# Patient Record
Sex: Female | Born: 1990 | Race: White | Hispanic: No | Marital: Married | Smoking: Never smoker
Health system: Southern US, Community
[De-identification: ages and names within clinical notes are randomized; demographics above are authoritative.]

## PROBLEM LIST (undated history)

## (undated) ENCOUNTER — Inpatient Hospital Stay (HOSPITAL_COMMUNITY): Payer: Self-pay

## (undated) DIAGNOSIS — D8940 Mast cell activation, unspecified: Secondary | ICD-10-CM

## (undated) DIAGNOSIS — B019 Varicella without complication: Secondary | ICD-10-CM

## (undated) DIAGNOSIS — S60811A Abrasion of right wrist, initial encounter: Secondary | ICD-10-CM

## (undated) DIAGNOSIS — U071 COVID-19: Secondary | ICD-10-CM

## (undated) DIAGNOSIS — I071 Rheumatic tricuspid insufficiency: Secondary | ICD-10-CM

## (undated) DIAGNOSIS — W57XXXA Bitten or stung by nonvenomous insect and other nonvenomous arthropods, initial encounter: Secondary | ICD-10-CM

## (undated) DIAGNOSIS — J45909 Unspecified asthma, uncomplicated: Secondary | ICD-10-CM

## (undated) DIAGNOSIS — N200 Calculus of kidney: Secondary | ICD-10-CM

## (undated) DIAGNOSIS — J45990 Exercise induced bronchospasm: Secondary | ICD-10-CM

## (undated) DIAGNOSIS — T7840XA Allergy, unspecified, initial encounter: Secondary | ICD-10-CM

## (undated) DIAGNOSIS — K219 Gastro-esophageal reflux disease without esophagitis: Secondary | ICD-10-CM

## (undated) DIAGNOSIS — J189 Pneumonia, unspecified organism: Secondary | ICD-10-CM

## (undated) DIAGNOSIS — Z8759 Personal history of other complications of pregnancy, childbirth and the puerperium: Secondary | ICD-10-CM

## (undated) HISTORY — PX: WISDOM TOOTH EXTRACTION: SHX21

## (undated) HISTORY — DX: Gastro-esophageal reflux disease without esophagitis: K21.9

## (undated) HISTORY — DX: COVID-19: U07.1

## (undated) HISTORY — DX: Varicella without complication: B01.9

## (undated) HISTORY — DX: Exercise induced bronchospasm: J45.990

## (undated) HISTORY — DX: Rheumatic tricuspid insufficiency: I07.1

## (undated) HISTORY — DX: Allergy, unspecified, initial encounter: T78.40XA

## (undated) HISTORY — DX: Calculus of kidney: N20.0

## (undated) HISTORY — DX: Abrasion of right wrist, initial encounter: S60.811A

## (undated) HISTORY — DX: Bitten or stung by nonvenomous insect and other nonvenomous arthropods, initial encounter: W57.XXXA

## (undated) HISTORY — DX: Unspecified asthma, uncomplicated: J45.909

---

## 2017-06-04 DIAGNOSIS — J189 Pneumonia, unspecified organism: Secondary | ICD-10-CM

## 2017-06-04 HISTORY — DX: Pneumonia, unspecified organism: J18.9

## 2017-07-31 ENCOUNTER — Encounter: Payer: Self-pay | Admitting: Internal Medicine

## 2017-07-31 ENCOUNTER — Ambulatory Visit: Payer: Self-pay | Admitting: Internal Medicine

## 2017-07-31 VITALS — BP 100/60 | HR 76 | Temp 98.1°F | Ht 68.0 in | Wt 127.0 lb

## 2017-07-31 DIAGNOSIS — R631 Polydipsia: Secondary | ICD-10-CM | POA: Insufficient documentation

## 2017-07-31 DIAGNOSIS — J329 Chronic sinusitis, unspecified: Secondary | ICD-10-CM | POA: Diagnosis not present

## 2017-07-31 DIAGNOSIS — Z0184 Encounter for antibody response examination: Secondary | ICD-10-CM | POA: Diagnosis not present

## 2017-07-31 DIAGNOSIS — Z1159 Encounter for screening for other viral diseases: Secondary | ICD-10-CM

## 2017-07-31 DIAGNOSIS — Z1389 Encounter for screening for other disorder: Secondary | ICD-10-CM

## 2017-07-31 DIAGNOSIS — D229 Melanocytic nevi, unspecified: Secondary | ICD-10-CM

## 2017-07-31 DIAGNOSIS — Z0000 Encounter for general adult medical examination without abnormal findings: Secondary | ICD-10-CM | POA: Diagnosis not present

## 2017-07-31 DIAGNOSIS — E559 Vitamin D deficiency, unspecified: Secondary | ICD-10-CM | POA: Diagnosis not present

## 2017-07-31 DIAGNOSIS — Z1329 Encounter for screening for other suspected endocrine disorder: Secondary | ICD-10-CM

## 2017-07-31 DIAGNOSIS — Z1283 Encounter for screening for malignant neoplasm of skin: Secondary | ICD-10-CM

## 2017-07-31 DIAGNOSIS — J069 Acute upper respiratory infection, unspecified: Secondary | ICD-10-CM | POA: Diagnosis not present

## 2017-07-31 HISTORY — DX: Polydipsia: R63.1

## 2017-07-31 HISTORY — DX: Melanocytic nevi, unspecified: D22.9

## 2017-07-31 MED ORDER — AZITHROMYCIN 250 MG PO TABS: ORAL_TABLET | ORAL | 0 refills | Status: DC

## 2017-07-31 NOTE — Progress Notes (Addendum)
Chief Complaint  Patient presents with  . New Patient (Initial Visit)   New patient  1. She is c/w increased thirst x 2 years with h/o severe concussion with diabetes insipidus and wants to see an endocrinologist to have further w/u. She reports despite drinking plenty of water throughout the day she is still thirsty denies dry mouth or eyes. Reports she has been so thirsty and felt dehydrated in the past. She has a friend with DI and doing her research and after disc sx's with friend she feels like she has this and before getting pregnant she would like endocrinology w/u  2. Multiple nevi trunk not seen dermatology in years would like referral  3. New to area and needs name of OB/GYN last Dr. Mordecai Rasmussen she had 1st pap 1 year ago when she 89st became sexually active  4. C/o runny nose, cough x 1 week with h/o sinus infection. [facility] tx'ed with Zpack and felt better. She has tried nyquil for sx's at night and otc antihistamine     Review of Systems  Constitutional: Negative for fever.  HENT: Negative for sinus pain and sore throat.   Eyes: Negative for blurred vision.  Respiratory: Negative for cough.   Cardiovascular: Negative for chest pain.  Gastrointestinal: Negative for abdominal pain and nausea.  Musculoskeletal: Negative for falls.  Skin:       +moles   Neurological: Negative for headaches.  Endo/Heme/Allergies:       +increased thirst   Psychiatric/Behavioral: Negative for depression.   Past Medical History:  Diagnosis Date  . Allergy   . Asthma    exercise induced  . Chicken pox   . GERD (gastroesophageal reflux disease)    Past Surgical History:  Procedure Laterality Date  . WISDOM TOOTH EXTRACTION     Family History  Problem Relation Age of Onset  . Hypertension Mother   . Arthritis Maternal Grandmother   . Alcohol abuse Maternal Grandfather   . Diabetes Maternal Grandfather   . Cancer Paternal Grandmother        breast   .  Cancer Paternal Grandfather        prostate   . Miscarriages / [place] Sister    Social History   Socioeconomic History  . Marital status: Married    Spouse name: Not on file  . Number of children: Not on file  . Years of education: Not on file  . Highest education level: Not on file  Occupational History  . Not on file  Social Needs  . Financial resource strain: Not on file  . Food insecurity:    Worry: Not on file    Inability: Not on file  . Transportation needs:    Medical: Not on file    Non-medical: Not on file  Tobacco Use  . Smoking status: Never Smoker  . Smokeless tobacco: Never Used  Substance and Sexual Activity  . Alcohol use: Yes  . Drug use: Yes    Comment: husband   . Sexual activity: Yes    Partners: Male  Lifestyle  . Physical activity:    Days per week: Not on file    Minutes per session: Not on file  . Stress: Not on file  Relationships  . Social connections:    Talks on phone: Not on file    Gets together: Not on file    Attends religious service: Not on file    Active member of club or organization:  Not on file    Attends meetings of clubs or organizations: Not on file    Relationship status: Not on file  . Intimate partner violence:    Fear of current or ex partner: Not on file    Emotionally abused: Not on file    Physically abused: Not on file    Forced sexual activity: Not on file  Other Topics Concern  . 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 Prenatal Vit-Fe Fumarate-FA (PRENATAL MULTIVITAMIN) TABS tablet Take 1 tablet by mouth daily at 12 noon.   Allergies  Allergen Reactions  . Tetracycline Hives    Sob   No results found for this or any previous visit (from the past 2160 hour(s)). Objective  Body mass index is 19.31 kg/m. Wt Readings from Last 3  Encounters:  07/31/17 127 lb (57.6 kg)   Temp Readings from Last 3 Encounters:  07/31/17 98.1 F (36.7 C) (Oral)   BP Readings from Last 3 Encounters:  07/31/17 100/60   Pulse Readings from Last 3 Encounters:  07/31/17 76    Physical Exam  Constitutional: She is oriented to person, place, and time. Vital signs are normal. She appears well-developed and well-nourished. She is cooperative.  HENT:  Head: Normocephalic and atraumatic.  Mouth/Throat: Oropharynx is clear and moist and mucous membranes are normal.  Eyes: Pupils are equal, round, and reactive to light. Conjunctivae are normal.  Cardiovascular: Normal rate, regular rhythm and normal heart sounds.  Pulmonary/Chest: Effort normal and breath sounds normal.  Neurological: She is alert and oriented to person, place, and time. Gait normal.  Skin: Skin is warm, dry and intact.  Multiple nevi to trunk   Psychiatric: She has a normal mood and affect. Her speech is normal and behavior is normal. Judgment and thought content normal. Cognition and memory are normal.  Nursing note and vitals reviewed.   Assessment   1. 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 Multiple nevi trunk  3. URI/sinusitis with h/o asthma  4. HM Plan   1.  Check labs today  Refer to [place] Dr. Gabriel Carina further w/u per pt request  2. Refer to Dr. Kellie Moor tbse  3. Supportive care if not better trial of zpack  4.  Did not have flu shot this year  Tdap 01/22/01 pt unsure if had since then look prior pcp records  Had gardisil 1/3 shots  Had hep B, MMR Had meningo vaccine 12/30/03  check hep B and MMR titer Pap had 04/13/16 neg pap HPV not tested Dr. Mikey Kirschner   Get records [facility] prior PCP Get records OB/GYN Dr. Patrick North pap given name of Dr. Georgianne Fick today LMP 07/16/17  -obtained records Dr. Christoper Fabian reviewed and scanned  Consider check lipid in future not fasting today  Provider: Dr. Olivia Mackie [name]

## 2017-07-31 NOTE — Patient Instructions (Addendum)
I will refer you to [facility] endocrine requested  Dr. Gabriel Carina [place] [phone]   I will refer you to dermatology Dr. Karle Barr  [place] [phone] for skin check   I rec OB/GYN Dr. Ander Purpura [facility] [place] [phone]     Diabetes Insipidus Diabetes insipidus (DI) is a rare condition that causes the body to produce more urine than normal, which leads to thirst and dehydration. The urine is made mostly of water (dilute urine). There are four types of DI:  Central DI. This is the most common type.  Dipsogenic DI.  Nephrogenic DI.  Pregnancy-related DI.  The most common forms are related to decreased production of the hormone that regulates urine production (antidiuretic hormone) or resistance to this hormone. DI can be managed with treatment and is not usually serious. This condition is not related to type 1 or type 2 diabetes mellitus, in which blood sugar (glucose) levels are too high. DI affects mostly adults, but it can happen at any age. What are the causes? Central DI is caused by damage to the pituitary gland or hypothalamus in the brain. Dipsogenic DI is caused by a defect in the thirst mechanism in the brain. This defect causes you to drink too much fluid. These may result from:  Brain surgery.  Infection.  Inflammation.  Brain tumor.  Head injury.  Nephrogenic DI is caused by the kidneys not responding to the antidiuretic hormone in the body. This may result from:  Chronic kidney disease (CKD).  Certain medicines, such as lithium.  Low potassium levels.  High calcium levels.  Pregnancy-related DI is caused by the antidiuretic hormone not working properly in the body. This results from having a temporary form of diabetes mellitus that develops during pregnancy (gestational diabetes mellitus). What are the signs or symptoms? Symptoms of this condition include:  Excessive urination. This means  urinating more than 10 cups (2.4 L) during a period of 24 hours.  Excessive thirst.  Frequent nighttime urination (nocturia).  Nausea.  Diarrhea.  How is this diagnosed? This condition may be diagnosed based on:  Your medical history.  A physical exam.  Blood tests.  Urine tests.  How is this treated? Once your specific type of diabetes insipidus is diagnosed, treatment may include one or more of the following:  Increasing or limiting your fluid intake.  Taking medicines that contain artificial (synthetic) versions of the antidiuretic hormone.  Stopping certain medicines that you take.  Correcting the balance of minerals (electrolytes) in your body.  Changing your diet. You may be put on a low-protein or low-sodium diet.  You may need to visit your health care provider regularly to make sure your condition is being treated properly. You may also need to work with providers who specialize in:  Kidney problems (nephrologist).  Hormone disorders (endocrinologist).  Follow these instructions at home:  Follow instructions from your health care provider about how much fluid and water to drink. You may be directed to drink more fluids and water, or to limit how much fluid and water you drink.  Follow instructions from your health care provider about eating or drinking restrictions.  Take over-the-counter and prescription medicines only as told by your health care provider.  Return to your normal activities as told by your health care provider. Ask your health care provider what activities are safe for you.  If directed, monitor your risk of dehydration in extreme heat.  Keep all follow-up visits as told by your health care provider. This is important.  Carry a medical alert card or wear medical alert jewelry. Contact a health care provider if:  You continue to have symptoms after treatment. Get help right away if:  You have extreme thirst.  You have symptoms of  severe dehydration, such as rapid heart rate, muscle cramps, or confusion. Summary  Diabetes insipidus (DI) is a rare condition that causes the body to produce more urine than normal, which leads to thirst and dehydration.  Follow instructions from your health care provider about eating or drinking restrictions.  Treatment may include increasing or limiting your fluid intake and correcting the balance of minerals (electrolytes) in your body.  Get help right away if you have symptoms of severe dehydration, such as rapid heart rate, muscle cramps, or confusion. This information is not intended to replace advice given to you by your health care provider. Make sure you discuss any questions you have with your health care provider. Document Released: 02/25/2013 Document Revised: 11/30/2015 Document Reviewed: 11/22/2015 Elsevier Interactive Patient Education  [employer].

## 2017-07-31 NOTE — Progress Notes (Signed)
Pre visit review using our clinic review tool, if applicable. No additional management support is needed unless otherwise documented below in the visit note. 

## 2017-08-01 LAB — MEASLES/MUMPS/RUBELLA IMMUNITY
Mumps IgG: 32.8 AU/mL
RUBELLA: 14.3 {index}
Rubeola IgG: 79.5 AU/mL

## 2017-08-01 LAB — URINALYSIS, ROUTINE W REFLEX MICROSCOPIC
Bilirubin, UA: NEGATIVE
Glucose, UA: NEGATIVE
Ketones, UA: NEGATIVE
Nitrite, UA: NEGATIVE
PH UA: 7 (ref 5.0–7.5)
PROTEIN UA: NEGATIVE
RBC, UA: NEGATIVE
Specific Gravity, UA: 1.007 (ref 1.005–1.030)
Urobilinogen, Ur: 0.2 mg/dL (ref 0.2–1.0)

## 2017-08-01 LAB — CBC WITH DIFFERENTIAL/PLATELET
BASOS PCT: 1.4 % (ref 0.0–3.0)
Basophils Absolute: 0.1 10*3/uL (ref 0.0–0.1)
EOS PCT: 1.4 % (ref 0.0–5.0)
Eosinophils Absolute: 0.1 10*3/uL (ref 0.0–0.7)
HCT: 41.2 % (ref 36.0–46.0)
Hemoglobin: 13.8 g/dL (ref 12.0–15.0)
LYMPHS ABS: 1.8 10*3/uL (ref 0.7–4.0)
Lymphocytes Relative: 27.5 % (ref 12.0–46.0)
MCHC: 33.5 g/dL (ref 30.0–36.0)
MCV: 91.4 fl (ref 78.0–100.0)
MONO ABS: 0.5 10*3/uL (ref 0.1–1.0)
Monocytes Relative: 7.8 % (ref 3.0–12.0)
NEUTROS PCT: 61.9 % (ref 43.0–77.0)
Neutro Abs: 4.1 10*3/uL (ref 1.4–7.7)
Platelets: 202 10*3/uL (ref 150.0–400.0)
RBC: 4.5 Mil/uL (ref 3.87–5.11)
RDW: 12.4 % (ref 11.5–15.5)
WBC: 6.6 10*3/uL (ref 4.0–10.5)

## 2017-08-01 LAB — TSH: TSH: 2.26 u[IU]/mL (ref 0.35–4.50)

## 2017-08-01 LAB — COMPREHENSIVE METABOLIC PANEL
ALK PHOS: 43 U/L (ref 39–117)
ALT: 21 U/L (ref 0–35)
AST: 26 U/L (ref 0–37)
Albumin: 4.6 g/dL (ref 3.5–5.2)
BUN: 13 mg/dL (ref 6–23)
CHLORIDE: 101 meq/L (ref 96–112)
CO2: 27 mEq/L (ref 19–32)
CREATININE: 0.84 mg/dL (ref 0.40–1.20)
Calcium: 9.5 mg/dL (ref 8.4–10.5)
GFR: 85.59 mL/min (ref >60.00)
GLUCOSE: 89 mg/dL (ref 70–99)
POTASSIUM: 3.6 meq/L (ref 3.5–5.1)
SODIUM: 139 meq/L (ref 135–145)
TOTAL PROTEIN: 7.9 g/dL (ref 6.0–8.3)
Total Bilirubin: 0.2 mg/dL (ref 0.2–1.2)

## 2017-08-01 LAB — T4, FREE: Free T4: 0.69 ng/dL (ref 0.60–1.60)

## 2017-08-01 LAB — MICROSCOPIC EXAMINATION
BACTERIA UA: NONE SEEN
Casts: NONE SEEN /lpf

## 2017-08-01 LAB — VITAMIN D 25 HYDROXY (VIT D DEFICIENCY, FRACTURES): VITD: 29.57 ng/mL — AB (ref 30.00–100.00)

## 2017-08-01 LAB — HEPATITIS B SURFACE ANTIBODY, QUANTITATIVE: Hepatitis B-Post: 116 m[IU]/mL (ref >=10)

## 2017-08-06 ENCOUNTER — Telehealth: Payer: Self-pay | Admitting: Internal Medicine

## 2017-08-06 NOTE — Telephone Encounter (Signed)
ROI received from [facility] on 08/06/2017.

## 2017-08-15 ENCOUNTER — Encounter: Payer: Self-pay | Admitting: Internal Medicine

## 2017-08-15 NOTE — Progress Notes (Signed)
Reviewed pap 04/13/16 negative unable to view HPV result   [place]

## 2017-09-05 ENCOUNTER — Telehealth: Payer: Self-pay | Admitting: *Deleted

## 2017-09-05 NOTE — Telephone Encounter (Signed)
It was sent to [facility]. I have changed it to [facility]

## 2017-09-05 NOTE — Telephone Encounter (Signed)
Copied from [place] [phone]. Topic: Referral - Question >> Sep 05, 2017  9:38 AM Judyann Munson wrote: Reason for CRM: Patient called to check the status of her referral for  to a endocrinology, she stated she called [facility] clinic endocrine  with Dr. Gabriel Carina [place] [phone], and they stated they did not receive her referral paper. Please advise

## 2017-12-04 ENCOUNTER — Ambulatory Visit: Payer: BC Managed Care – PPO | Admitting: Internal Medicine

## 2017-12-04 DIAGNOSIS — Z0289 Encounter for other administrative examinations: Secondary | ICD-10-CM

## 2017-12-30 ENCOUNTER — Encounter (HOSPITAL_COMMUNITY): Payer: Self-pay | Admitting: *Deleted

## 2017-12-30 ENCOUNTER — Inpatient Hospital Stay (HOSPITAL_COMMUNITY): Payer: BC Managed Care – PPO

## 2017-12-30 ENCOUNTER — Inpatient Hospital Stay (HOSPITAL_COMMUNITY)
Admission: AD | Admit: 2017-12-30 | Discharge: 2017-12-30 | Disposition: A | Discharge status: Home / Self Care | Payer: BC Managed Care – PPO | Source: Ambulatory Visit | Attending: Obstetrics & Gynecology | Admitting: Obstetrics & Gynecology

## 2017-12-30 DIAGNOSIS — O26891 Other specified pregnancy related conditions, first trimester: Secondary | ICD-10-CM | POA: Diagnosis present

## 2017-12-30 DIAGNOSIS — O208 Other hemorrhage in early pregnancy: Secondary | ICD-10-CM | POA: Diagnosis not present

## 2017-12-30 DIAGNOSIS — O021 Missed abortion: Secondary | ICD-10-CM | POA: Insufficient documentation

## 2017-12-30 DIAGNOSIS — O209 Hemorrhage in early pregnancy, unspecified: Secondary | ICD-10-CM

## 2017-12-30 DIAGNOSIS — N939 Abnormal uterine and vaginal bleeding, unspecified: Secondary | ICD-10-CM | POA: Diagnosis present

## 2017-12-30 LAB — URINALYSIS, ROUTINE W REFLEX MICROSCOPIC
BACTERIA UA: NONE SEEN
BILIRUBIN URINE: NEGATIVE
Glucose, UA: NEGATIVE mg/dL
Ketones, ur: NEGATIVE mg/dL
Leukocytes, UA: NEGATIVE
Nitrite: NEGATIVE
PROTEIN: NEGATIVE mg/dL
Specific Gravity, Urine: 1.001 — ABNORMAL LOW (ref 1.005–1.030)
pH: 6 (ref 5.0–8.0)

## 2017-12-30 LAB — CBC WITH DIFFERENTIAL/PLATELET
BASOS ABS: 0 10*3/uL (ref 0.0–0.1)
BASOS PCT: 0 %
EOS PCT: 2 %
Eosinophils Absolute: 0.1 10*3/uL (ref 0.0–0.5)
HCT: 36.2 % (ref 36.0–46.0)
Hemoglobin: 12.4 g/dL (ref 12.0–15.0)
Lymphocytes Relative: 23 %
Lymphs Abs: 1.1 10*3/uL (ref 0.7–4.0)
MCH: 30.8 pg (ref 26.0–34.0)
MCHC: 34.3 g/dL (ref 30.0–36.0)
MCV: 89.8 fL (ref 80.0–100.0)
Monocytes Absolute: 0.3 10*3/uL (ref 0.1–1.0)
Monocytes Relative: 6 %
NEUTROS ABS: 3.2 10*3/uL (ref 1.7–7.7)
NRBC: 0 % (ref 0.0–0.2)
Neutrophils Relative %: 69 %
PLATELETS: 179 10*3/uL (ref 150–400)
RBC: 4.03 MIL/uL (ref 3.87–5.11)
RDW: 12.4 % (ref 11.5–15.5)
WBC: 4.6 10*3/uL (ref 4.0–10.5)

## 2017-12-30 LAB — ABO/RH: ABO/RH(D): A POS

## 2017-12-30 LAB — POCT PREGNANCY, URINE: PREG TEST UR: POSITIVE — AB

## 2017-12-30 NOTE — Discharge Instructions (Signed)
Incomplete Miscarriage A miscarriage is the sudden loss of an unborn baby (fetus) before the 20th week of pregnancy. In an incomplete miscarriage, parts of the fetus or placenta (afterbirth) remain in the body. Having a miscarriage can be an emotional experience. Talk with your health care provider about any questions you may have about miscarrying, the grieving process, and your future pregnancy plans. What are the causes?  Problems with the fetal chromosomes that make it impossible for the baby to develop normally. Problems with the baby's genes or chromosomes are most often the result of errors that occur by chance as the embryo divides and grows. The problems are not inherited from the parents.  Infection of the cervix or uterus.  Hormone problems.  Problems with the cervix, such as having an incompetent cervix. This is when the tissue in the cervix is not strong enough to hold the pregnancy.  Problems with the uterus, such as an abnormally shaped uterus, uterine fibroids, or congenital abnormalities.  Certain medical conditions.  Smoking, drinking alcohol, or taking illegal drugs.  Trauma. What are the signs or symptoms?  Vaginal bleeding or spotting, with or without cramps or pain.  Pain or cramping in the abdomen or lower back.  Passing fluid, tissue, or blood clots from the vagina. How is this diagnosed? Your health care provider will perform a physical exam. You may also have an ultrasound to confirm the miscarriage. Blood or urine tests may also be ordered. How is this treated? MANAGEMNT OF EARLY PREGNANCY FAILURE: About 4 out of 100 (0.25%) women will have a pregnancy loss in her lifetime.  One in five pregnancies is found to be an early pregnancy failure.  There are 3 ways to care for an early pregnancy failure:   (1) Surgery, (2) Medicine, (3) Waiting for you to pass the pregnancy on your own. The decision as to how to proceed after being diagnosed with and early  pregnancy failure is an individual one.  The decision can be made only after appropriate counseling.  You need to weigh the pros and cons of the 3 choices. Then you can make the choice that works for you. SURGERY (D&E)  Procedure over in 1 day  Requires being put to sleep  Bleeding may be light  Possible problems during surgery, including injury to womb(uterus)  Care provider has more control Medicine (CYTOTEC)  The complete procedure may take days to weeks  No Surgery  Bleeding may be heavy at times  There may be drug side effects  Patient has more control Waiting  You may choose to wait, in which case your own body may complete the passing of the abnormal early pregnancy on its own in about 2-4 weeks  Your bleeding may be heavy at times  There is a small possibility that you may need surgery if the bleeding is too much or not all of the pregnancy has passed. CYTOTEC MANAGEMENT Prostaglandins (cytotec) are the most widely used drug for this purpose. They cause the uterus to cramp and contract. You will place the medicine yourself inside your vagina in the privacy of your home. Empting of the uterus should occur within 3 days but the process may continue for several weeks. The bleeding may seem heavy at times. POSSIBLE SIDE EFFECTS FROM CYTOTEC  Nausea   Vomiting  Diarrhea Fever  Chills  Hot Flashes Side effects  from the process of the early pregnancy failure include:  Cramping  Bleeding  Headaches  Dizziness RISKS: This is  a low risk procedure. Less than 1 in 100 women has a complication. An incomplete passage of the early pregnancy may occur. Also, Hemorrhage (heavy bleeding) could happen.  Rarely the pregnancy will not be passed completely. Excessively heavy bleeding may occur.  Your doctor may need to perform surgery to empty the uterus (D&E). Afterwards: Everybody will feel differently after the early pregnancy completion. You may have soreness or cramps for a day  or two. You may have soreness or cramps for day or two.  You may have light bleeding for up to 2 weeks. You may be as active as you feel like being. If you have any of the following problems you may call Maternity Admissions Unit at [phone].  If you have pain that does not get better  with pain medication  Bleeding that soaks through 2 thick full-sized sanitary pads in an hour  Cramps that last longer than 2 days  Foul smelling discharge  Fever above 100.4 degrees F Even if you do not have any of these symptoms, you should have a follow-up exam to make sure you are healing properly. This appointment will be made for you before you leave the hospital. Your next normal period will start again in 4-6 week after the loss. You can get pregnant soon after the loss, so use birth control right away. Finally: Make sure all your questions are answered before during and after any procedure. Follow up with medical care and family planning methods.

## 2017-12-30 NOTE — MAU Note (Signed)
Alison Harrison is a 27 y.o. here in [facility]U reporting: +vaginal bleeding. Started yesterday as spotting. Today more like a light period. +lower abdominal cramping; intermittent. EDD: 07/15/18 Pain score: 4/10 Patient very tearful during triage. Expressed that she is very worried. Vitals:   12/30/17 1710  BP: (!) 144/84  Pulse: 96  Resp: 18  Temp: 99.3 F (37.4 C)  SpO2: 99%    FHT: doppler attempted. Patient states they had to use an ultrasound in the office at her last visit. Marlou Porch CNM made aware. Lab orders placed from triage: ua

## 2017-12-30 NOTE — MAU Provider Note (Signed)
Chief Complaint: Vaginal Bleeding   First Provider Initiated Contact with Patient 12/30/17 1715     SUBJECTIVE HPI: Alison Harrison is a 27 y.o. G1P0 at [redacted]w[redacted]d who presents to Maternity Admissions reporting Vaginal bleeding since yesterday that increased today. Now similar to a period. States she has had 2 US's at [facility] showing live IUP S=D. Records not available.   Vaginal Bleeding: Small-mod Passage of tissue or clots: Denies Dizziness: Denies  A POS Performed at [facility], [address], [place], [place] [address]   Pain Location: Suprapubic Quality: cramping Severity: 4/10 on pain scale Duration: 2 days  Course: unchanged Context: First trimester. IUP previously verified by office [place] per pt.  Timing: intermittent Modifying factors: none. Hasn't tried anything Associated signs and symptoms: Pos for VB. Neg for fever, chills, vaginal discharge, urinary complaints, GI complaints.   Past Medical History:  Diagnosis Date  . Allergy   . Asthma    exercise induced  . Chicken pox   . GERD (gastroesophageal reflux disease)    OB History  Gravida Para Term Preterm AB Living  1            SAB TAB Ectopic Multiple Live Births               # Outcome Date GA Lbr Len/2nd Weight Sex Delivery Anes PTL Lv  1 Current            Past Surgical History:  Procedure Laterality Date  . WISDOM TOOTH EXTRACTION     Social History   Socioeconomic History  . Marital status: Married    Spouse name: Not on file  . Number of children: Not on file  . Years of education: Not on file  . Highest education level: Not on file  Occupational History  . Not on file  Social Needs  . Financial resource strain: Not on file  . Food insecurity:    Worry: Not on file    Inability: Not on file  . Transportation needs:    Medical: Not on file    Non-medical: Not on file  Tobacco Use  . Smoking status: Never Smoker  . Smokeless tobacco: Never Used  Substance and Sexual  Activity  . Alcohol use: Yes  . Drug use: Yes    Comment: husband   . Sexual activity: Yes    Partners: Male    Birth control/protection: None  Lifestyle  . Physical activity:    Days per week: Not on file    Minutes per session: Not on file  . Stress: Not on file  Relationships  . Social connections:    Talks on phone: Not on file    Gets together: Not on file    Attends religious service: Not on file    Active member of club or organization: Not on file    Attends meetings of clubs or organizations: Not on file    Relationship status: Not on file  . Intimate partner violence:    Fear of current or ex partner: Not on file    Emotionally abused: Not on file    Physically abused: Not on file    Forced sexual activity: Not on file  Other Topics Concern  . 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    Current Outpatient Medications on File Prior to Encounter  Medication Sig Dispense Refill  . azithromycin (ZITHROMAX) 250 MG tablet 2 pills day 1 and 1 pill day 2-5 6 tablet 0  . Prenatal Vit-Fe Fumarate-FA (PRENATAL MULTIVITAMIN) TABS tablet Take 1 tablet by mouth daily at 12 noon.     Allergies  Allergen Reactions  . Tetracycline Hives    Sob    I have reviewed the past Medical Hx, Surgical Hx, Social Hx, Allergies and Medications.   Review of Systems  Constitutional: Negative for chills and fever.  Gastrointestinal: Positive for abdominal pain. Negative for constipation, diarrhea, nausea and vomiting.  Genitourinary: Positive for vaginal bleeding. Negative for dysuria, flank pain, frequency, hematuria and vaginal discharge.  Musculoskeletal: Negative for back pain.  Neurological: Negative for dizziness.     OBJECTIVE Patient Vitals for the past 24 hrs:  BP Temp Temp src Pulse Resp SpO2 Weight  12/30/17 1944 121/80 - - (!) 102 18 - -  12/30/17 1836 132/69 - - 95 - - -  12/30/17 1710 (!) 144/84 99.3 F (37.4 C) Oral 96 18 99 % -  12/30/17 1700 - - - - - - 58.6 kg   Constitutional: Well-developed, well-nourished female in no acute distress. Very anxious, tearful Cardiovascular: normal rate Respiratory: normal rate and effort.  GI: Abd soft, non-tender. Fundus non-palpable MS: Extremities nontender, no edema, normal ROM Neurologic: Alert and oriented x 4.  GU:  SPECULUM EXAM: NEFG, physiologic discharge, small amount of dark red blood noted, cervix clean, Nml ectropion, non-friable  BIMANUAL: cervix closed; uterus 10 week size, no adnexal tenderness or masses.  No CMT.  LAB RESULTS Results for orders placed or performed during the hospital encounter of 12/30/17 (from the past 24 hour(s))  Urinalysis, Routine w reflex microscopic     Status: Abnormal   Collection Time: 12/30/17  4:58 PM  Result Value Ref Range   Color, Urine COLORLESS (A) YELLOW   APPearance CLEAR CLEAR   Specific Gravity, Urine 1.001 (L) 1.005 - 1.030   pH 6.0 5.0 - 8.0   Glucose, UA NEGATIVE NEGATIVE mg/dL   Hgb urine dipstick MODERATE (A) NEGATIVE   Bilirubin Urine NEGATIVE NEGATIVE   Ketones, ur NEGATIVE NEGATIVE mg/dL   Protein, ur NEGATIVE NEGATIVE mg/dL   Nitrite NEGATIVE NEGATIVE   Leukocytes, UA NEGATIVE NEGATIVE   RBC / HPF 0-5 0 - 5 RBC/hpf   WBC, UA 0-5 0 - 5 WBC/hpf   Bacteria, UA NONE SEEN NONE SEEN   Squamous Epithelial / LPF 0-5 0 - 5  Pregnancy, urine POC     Status: Abnormal   Collection Time: 12/30/17  5:19 PM  Result Value Ref Range   Preg Test, Ur POSITIVE (A) NEGATIVE  CBC with Differential/Platelet     Status: None   Collection Time: 12/30/17  5:34 PM  Result Value Ref Range   WBC 4.6 4.0 - 10.5 K/uL   RBC 4.03 3.87 - 5.11 MIL/uL   Hemoglobin 12.4 12.0 - 15.0 g/dL   HCT 36.2 36.0 -  46.0 %   MCV 89.8 80.0 - 100.0 fL   MCH 30.8 26.0 - 34.0 pg   MCHC 34.3 30.0 - 36.0 g/dL   RDW 12.4 11.5 - 15.5 %   Platelets 179 150 - 400 K/uL   nRBC 0.0 0.0 - 0.2 %   Neutrophils Relative % 69 %  Neutro Abs 3.2 1.7 - 7.7 K/uL   Lymphocytes Relative 23 %   Lymphs Abs 1.1 0.7 - 4.0 K/uL   Monocytes Relative 6 %   Monocytes Absolute 0.3 0.1 - 1.0 K/uL   Eosinophils Relative 2 %   Eosinophils Absolute 0.1 0.0 - 0.5 K/uL   Basophils Relative 0 %   Basophils Absolute 0.0 0.0 - 0.1 K/uL  ABO/Rh     Status: None (Preliminary result)   Collection Time: 12/30/17  5:34 PM  Result Value Ref Range   ABO/RH(D)      A POS Performed at [facility], [address], [place], [place] [address]     IMAGING US Ob Comp Less 14 Wks  Result Date: 12/30/2017 CLINICAL DATA:  Vaginal bleeding. EXAM: OBSTETRIC <14 WK [place] AND TRANSVAGINAL OB US TECHNIQUE: Both transabdominal and transvaginal ultrasound examinations were performed for complete evaluation of the gestation as well as the maternal uterus, adnexal regions, and pelvic cul-de-sac. Transvaginal technique was performed to assess early pregnancy. COMPARISON:  None. FINDINGS: Intrauterine gestational sac: Single Yolk sac:  Not Visualized. Embryo:  Visualized. Cardiac Activity: Not Visualized. MSD:   mm    w     d CRL:  17.7 mm mm   8 w   1 d                  [place] EDC: Subchorionic hemorrhage:  None visualized. Maternal uterus/adnexae: Normal in appearance. IMPRESSION: 1. An intrauterine pregnancy is identified. However, there is no cardiac activity within the fetus. Findings meet definitive criteria for failed pregnancy. This follows SRU consensus guidelines: Diagnostic Criteria for Nonviable Pregnancy Early in the First Trimester. Alison Stalling J Med [phone]. Electronically Signed   By: Dorise Bullion III M.D   On: 12/30/2017 18:49   US Ob Transvaginal  Result Date: 12/30/2017 CLINICAL DATA:  Vaginal bleeding. EXAM: OBSTETRIC <14 WK [place] AND  TRANSVAGINAL OB US TECHNIQUE: Both transabdominal and transvaginal ultrasound examinations were performed for complete evaluation of the gestation as well as the maternal uterus, adnexal regions, and pelvic cul-de-sac. Transvaginal technique was performed to assess early pregnancy. COMPARISON:  None. FINDINGS: Intrauterine gestational sac: Single Yolk sac:  Not Visualized. Embryo:  Visualized. Cardiac Activity: Not Visualized. MSD:   mm    w     d CRL:  17.7 mm mm   8 w   1 d                  [place] EDC: Subchorionic hemorrhage:  None visualized. Maternal uterus/adnexae: Normal in appearance. IMPRESSION: 1. An intrauterine pregnancy is identified. However, there is no cardiac activity within the fetus. Findings meet definitive criteria for failed pregnancy. This follows SRU consensus guidelines: Diagnostic Criteria for Nonviable Pregnancy Early in the First Trimester. Alison Stalling J Med [phone]. Electronically Signed   By: Dorise Bullion III M.D   On: 12/30/2017 18:49    MAU COURSE CBC, Quant, ABO/Rh, ultrasound, wet prep and GC/chlamydia culture, UA  MDM Missed AB. hemodynamically stable. Discussed w/ Dr. Lynnette Caffey who plans to have office call pt in the morning and schedule appt w/ her primary (Dr. Royston Sinner). Discussed options for management of incomplete AB including expectant management, Cytotec or D&C. Will discuss w/ Dr. Royston Sinner.  ASSESSMENT 1. Missed abortion with fetal demise before 49 completed weeks of gestation   2. Vaginal bleeding affecting early pregnancy     PLAN Discharge home in stable condition. Bleeding precautions Support  given. Offer Chaplain, declined IBU  PRN for cramping  Follow-up Information    [place] Follow up.   Why:  Will call you in the morning to schedule a follow-up appointment.   Contact information: [place] [address] [phone]        [place] ASSESSMENT [facility] Follow up.   Why:  as needed in  emergencies (soaking more than a pad an hour, fever higher than 100.4 or severe pain)  Contact information: [address] [id number] [place] [phone]         Allergies as of 12/30/2017      Reactions   Tetracycline Hives   Sob      Medication List    STOP taking these medications   azithromycin 250 MG tablet Commonly known as:  ZITHROMAX     TAKE these medications   prenatal multivitamin Tabs tablet Take 1 tablet by mouth daily at 12 noon.        Tamala Julian, [place], [place] 12/30/2017  7:50 PM  4

## 2018-01-01 LAB — CULTURE, OB URINE: CULTURE: NO GROWTH

## 2018-01-01 NOTE — H&P (Signed)
Alison Harrison is an 27 y.o. female. Presenting for D&E for MAB. Had MAB dx at 12.0 wga measuring ~ 9 wga. S/p cytotec x 2 doses without passage of pregnancy.   Pertinent Gynecological History: Menses: pregnant Bleeding: normal prior to pregnancy Contraception: none DES exposure: denies Blood transfusions: none Sexually transmitted diseases: no past history Previous GYN Procedures: n/a  Last mammogram: n/a Date Last pap: normal Date: normal per pt OB History: G1, P0   Menstrual History: Patient's last menstrual period was 07/16/2017 (exact date).    Past Medical History:  Diagnosis Date  . Allergy   . Asthma    exercise induced  . Chicken pox   . GERD (gastroesophageal reflux disease)     Past Surgical History:  Procedure Laterality Date  . WISDOM TOOTH EXTRACTION      Family History  Problem Relation Age of Onset  . Hypertension Mother   . Arthritis Maternal Grandmother   . Alcohol abuse Maternal Grandfather   . Diabetes Maternal Grandfather   . Cancer Paternal Grandmother        breast   . Cancer Paternal Grandfather        prostate   . Miscarriages / [place] Sister     Social History:  reports that she has never smoked. She has never used smokeless tobacco. She reports that she drinks alcohol. She reports that she has current or past drug history.  Allergies:  Allergies  Allergen Reactions  . Tetracycline Hives    Sob    No medications prior to admission.    ROS  Last menstrual period 07/16/2017. Physical Exam Gen: well appearing, NAD CV: Reg rate Pulm: NWOB Abd: soft, nondistended, nontender, no masses GYN: uterus 10 week size, no adnexa ttp/CMT Ext: No edema b/l  No results found for this or any previous visit (from the past 24 hour(s)).  US Ob Comp Less 14 Wks  Result Date: 12/30/2017 CLINICAL DATA:  Vaginal bleeding. EXAM: OBSTETRIC <14 WK [place] AND TRANSVAGINAL OB US TECHNIQUE: Both transabdominal and transvaginal ultrasound  examinations were performed for complete evaluation of the gestation as well as the maternal uterus, adnexal regions, and pelvic cul-de-sac. Transvaginal technique was performed to assess early pregnancy. COMPARISON:  None. FINDINGS: Intrauterine gestational sac: Single Yolk sac:  Not Visualized. Embryo:  Visualized. Cardiac Activity: Not Visualized. MSD:   mm    w     d CRL:  17.7 mm mm   8 w   1 d                  [place] EDC: Subchorionic hemorrhage:  None visualized. Maternal uterus/adnexae: Normal in appearance. IMPRESSION: 1. An intrauterine pregnancy is identified. However, there is no cardiac activity within the fetus. Findings meet definitive criteria for failed pregnancy. This follows SRU consensus guidelines: Diagnostic Criteria for Nonviable Pregnancy Early in the First Trimester. Alison Stalling J Med [phone]. Electronically Signed   By: Dorise Bullion III M.D   On: 12/30/2017 18:49   US Ob Transvaginal  Result Date: 12/30/2017 CLINICAL DATA:  Vaginal bleeding. EXAM: OBSTETRIC <14 WK [place] AND TRANSVAGINAL OB US TECHNIQUE: Both transabdominal and transvaginal ultrasound examinations were performed for complete evaluation of the gestation as well as the maternal uterus, adnexal regions, and pelvic cul-de-sac. Transvaginal technique was performed to assess early pregnancy. COMPARISON:  None. FINDINGS: Intrauterine gestational sac: Single Yolk sac:  Not Visualized. Embryo:  Visualized. Cardiac Activity: Not Visualized. MSD:   mm    w  d CRL:  17.7 mm mm   8 w   1 d                  [place] EDC: Subchorionic hemorrhage:  None visualized. Maternal uterus/adnexae: Normal in appearance. IMPRESSION: 1. An intrauterine pregnancy is identified. However, there is no cardiac activity within the fetus. Findings meet definitive criteria for failed pregnancy. This follows SRU consensus guidelines: Diagnostic Criteria for Nonviable Pregnancy Early in the First Trimester. Alison Stalling J Med [phone]. Electronically Signed    By: Dorise Bullion III M.D   On: 12/30/2017 18:49    Assessment/Plan: 27 yo G1P0 presenting s/p MAB. No FHT at 12.0 wga after + FHT noted at 7 and then again at [place]. She opted for cytotec to assist tissue passage after being counseled in the office and ultimately... RH +.   Tyson Dense 01/01/2018, 9:46 AM

## 2018-01-04 ENCOUNTER — Encounter (HOSPITAL_BASED_OUTPATIENT_CLINIC_OR_DEPARTMENT_OTHER): Payer: Self-pay

## 2018-01-10 ENCOUNTER — Encounter (HOSPITAL_BASED_OUTPATIENT_CLINIC_OR_DEPARTMENT_OTHER): Payer: Self-pay

## 2018-01-10 ENCOUNTER — Ambulatory Visit (HOSPITAL_BASED_OUTPATIENT_CLINIC_OR_DEPARTMENT_OTHER): Admit: 2018-01-10 | Payer: BC Managed Care – PPO | Admitting: Obstetrics and Gynecology

## 2018-01-10 HISTORY — DX: Pneumonia, unspecified organism: J18.9

## 2018-01-10 SURGERY — DILATION AND EVACUATION, UTERUS
Anesthesia: Choice

## 2018-03-06 NOTE — L&D Delivery Note (Signed)
Delivery Note At 10:36 PM a viable female was delivered via Vaginal, Spontaneous   Presentation LOA Apgars pending Weight pending Placenta routine Cord PH not sent  Complications Nuchal crod x 1, reduced with delivery  Anesthesia:  CLE Episiotomy: None Lacerations: 2nd degree Suture Repair: 2.0 3.0 vicryl Est. Blood Loss (mL):  200cc  It's a boy - "[name]" Marijean Bravo)!!   Mom to postpartum.  Baby to Couplet care / Skin to Skin.  Tyson Dense 01/17/2019, 10:56 PM

## 2018-04-03 DIAGNOSIS — N96 Recurrent pregnancy loss: Secondary | ICD-10-CM | POA: Insufficient documentation

## 2018-06-11 LAB — OB RESULTS CONSOLE ABO/RH: RH Type: POSITIVE

## 2018-06-11 LAB — OB RESULTS CONSOLE GC/CHLAMYDIA
Chlamydia: NEGATIVE
Gonorrhea: NEGATIVE

## 2018-06-11 LAB — OB RESULTS CONSOLE RPR: RPR: NONREACTIVE

## 2018-06-11 LAB — OB RESULTS CONSOLE HIV ANTIBODY (ROUTINE TESTING): HIV: NONREACTIVE

## 2018-06-11 LAB — OB RESULTS CONSOLE ANTIBODY SCREEN: Antibody Screen: NEGATIVE

## 2018-06-11 LAB — OB RESULTS CONSOLE RUBELLA ANTIBODY, IGM: Rubella: IMMUNE

## 2018-06-11 LAB — OB RESULTS CONSOLE HEPATITIS B SURFACE ANTIGEN: Hepatitis B Surface Ag: NEGATIVE

## 2018-10-07 ENCOUNTER — Encounter (HOSPITAL_COMMUNITY): Payer: Self-pay

## 2018-12-26 LAB — OB RESULTS CONSOLE GBS: GBS: POSITIVE

## 2019-01-13 ENCOUNTER — Telehealth (HOSPITAL_COMMUNITY): Payer: Self-pay | Admitting: *Deleted

## 2019-01-13 ENCOUNTER — Encounter (HOSPITAL_COMMUNITY): Payer: Self-pay | Admitting: *Deleted

## 2019-01-13 NOTE — Telephone Encounter (Signed)
Preadmission screen  

## 2019-01-15 ENCOUNTER — Other Ambulatory Visit: Payer: Self-pay

## 2019-01-15 ENCOUNTER — Other Ambulatory Visit (HOSPITAL_COMMUNITY)
Admission: RE | Admit: 2019-01-15 | Discharge: 2019-01-15 | Disposition: A | Discharge status: Home / Self Care | Payer: BC Managed Care – PPO | Source: Ambulatory Visit | Attending: Obstetrics and Gynecology | Admitting: Obstetrics and Gynecology

## 2019-01-15 DIAGNOSIS — Z01812 Encounter for preprocedural laboratory examination: Secondary | ICD-10-CM | POA: Insufficient documentation

## 2019-01-15 DIAGNOSIS — Z20828 Contact with and (suspected) exposure to other viral communicable diseases: Secondary | ICD-10-CM | POA: Insufficient documentation

## 2019-01-15 LAB — SARS CORONAVIRUS 2 (TAT 6-24 HRS): SARS Coronavirus 2: NEGATIVE

## 2019-01-15 NOTE — H&P (Addendum)
Alison Harrison is a 28 y.o. female presenting for IOL. Pregnancy uncomplicated. Hx RPL. Expecting a boy "Alison Harrison" Alison Harrison). Husband - Artis Delay.   OB History    Gravida  3   Para      Term      Preterm      AB  2   Living        SAB  2   TAB      Ectopic      Multiple      Live Births             Past Medical History:  Diagnosis Date  . Allergy   . Asthma    exercise induced  . Chicken pox   . GERD (gastroesophageal reflux disease)   . Pneumonia 06/2017   Past Surgical History:  Procedure Laterality Date  . WISDOM TOOTH EXTRACTION     Family History: family history includes Alcohol abuse in her maternal grandfather; Arthritis in her maternal grandmother; Cancer in her paternal grandfather and paternal grandmother; Diabetes in her maternal grandfather; Hypertension in her mother; Miscarriages / [place] in her sister. Social History:  reports that she has never smoked. She has never used smokeless tobacco. She reports previous alcohol use. She reports that she does not use drugs.     Maternal Diabetes: No Genetic Screening: Declined Maternal Ultrasounds/Referrals: Normal Fetal Ultrasounds or other Referrals:  None Maternal Substance Abuse:  No Significant Maternal Medications:  None Significant Maternal Lab Results:  None Other Comments:  None  ROS History   unknown if currently breastfeeding. Exam Physical Exam  (from office) NAD, A&O NWOB Abd soft, nondistended, gravid  Prenatal labs: ABO, Rh: A/Positive/-- (04/07 0000) Antibody: Negative (04/07 0000) Rubella: Immune (04/07 0000) RPR: Nonreactive (04/07 0000)  HBsAg: Negative (04/07 0000)  HIV: Non-reactive (04/07 0000)  GBS: Positive/-- (10/22 0000)   Assessment/Plan: 28 yo G3P0 @ 39.3 wga presenting for IOL s/s elective. Cervix unfavorable. Plan for cytotec followed by pitocin/AROM when more favorable.  GBS positive - PCN per protocol.     Tyson Dense 01/15/2019, 12:49  PM

## 2019-01-15 NOTE — MAU Note (Signed)
Covid swab performed. Pt is asymptomatic.

## 2019-01-17 ENCOUNTER — Inpatient Hospital Stay (HOSPITAL_COMMUNITY): Payer: BC Managed Care – PPO | Admitting: Anesthesiology

## 2019-01-17 ENCOUNTER — Other Ambulatory Visit: Payer: Self-pay

## 2019-01-17 ENCOUNTER — Inpatient Hospital Stay (HOSPITAL_COMMUNITY): Payer: BC Managed Care – PPO

## 2019-01-17 ENCOUNTER — Inpatient Hospital Stay (HOSPITAL_COMMUNITY)
Admission: AD | Admit: 2019-01-17 | Discharge: 2019-01-19 | DRG: 807 | Disposition: A | Discharge status: Home / Self Care | Payer: BC Managed Care – PPO | Attending: Obstetrics and Gynecology | Admitting: Obstetrics and Gynecology

## 2019-01-17 ENCOUNTER — Encounter (HOSPITAL_COMMUNITY): Payer: Self-pay

## 2019-01-17 DIAGNOSIS — Z3490 Encounter for supervision of normal pregnancy, unspecified, unspecified trimester: Secondary | ICD-10-CM

## 2019-01-17 DIAGNOSIS — Z3A39 39 weeks gestation of pregnancy: Secondary | ICD-10-CM

## 2019-01-17 DIAGNOSIS — O99824 Streptococcus B carrier state complicating childbirth: Secondary | ICD-10-CM | POA: Diagnosis present

## 2019-01-17 DIAGNOSIS — O26893 Other specified pregnancy related conditions, third trimester: Secondary | ICD-10-CM | POA: Diagnosis present

## 2019-01-17 LAB — RPR: RPR Ser Ql: NONREACTIVE

## 2019-01-17 LAB — COMPREHENSIVE METABOLIC PANEL
ALT: 38 U/L (ref 0–44)
AST: 26 U/L (ref 15–41)
Albumin: 2.6 g/dL — ABNORMAL LOW (ref 3.5–5.0)
Alkaline Phosphatase: 84 U/L (ref 38–126)
Anion gap: 11 (ref 5–15)
BUN: 7 mg/dL (ref 6–20)
CO2: 21 mmol/L — ABNORMAL LOW (ref 22–32)
Calcium: 8.7 mg/dL — ABNORMAL LOW (ref 8.9–10.3)
Chloride: 103 mmol/L (ref 98–111)
Creatinine, Ser: 0.74 mg/dL (ref 0.44–1.00)
GFR calc Af Amer: >60 mL/min (ref >60)
GFR calc non Af Amer: >60 mL/min (ref >60)
Glucose, Bld: 81 mg/dL (ref 70–99)
Potassium: 3.9 mmol/L (ref 3.5–5.1)
Sodium: 135 mmol/L (ref 135–145)
Total Bilirubin: 0.4 mg/dL (ref 0.3–1.2)
Total Protein: 5.7 g/dL — ABNORMAL LOW (ref 6.5–8.1)

## 2019-01-17 LAB — TYPE AND SCREEN
ABO/RH(D): A POS
Antibody Screen: NEGATIVE

## 2019-01-17 LAB — CBC
HCT: 32.9 % — ABNORMAL LOW (ref 36.0–46.0)
HCT: 34 % — ABNORMAL LOW (ref 36.0–46.0)
Hemoglobin: 11 g/dL — ABNORMAL LOW (ref 12.0–15.0)
Hemoglobin: 11.4 g/dL — ABNORMAL LOW (ref 12.0–15.0)
MCH: 30.3 pg (ref 26.0–34.0)
MCH: 30.8 pg (ref 26.0–34.0)
MCHC: 33.4 g/dL (ref 30.0–36.0)
MCHC: 33.5 g/dL (ref 30.0–36.0)
MCV: 90.4 fL (ref 80.0–100.0)
MCV: 92.2 fL (ref 80.0–100.0)
Platelets: 149 10*3/uL — ABNORMAL LOW (ref 150–400)
Platelets: 151 10*3/uL (ref 150–400)
RBC: 3.57 MIL/uL — ABNORMAL LOW (ref 3.87–5.11)
RBC: 3.76 MIL/uL — ABNORMAL LOW (ref 3.87–5.11)
RDW: 12.1 % (ref 11.5–15.5)
RDW: 12.3 % (ref 11.5–15.5)
WBC: 7.9 10*3/uL (ref 4.0–10.5)
WBC: 8.1 10*3/uL (ref 4.0–10.5)
nRBC: 0 % (ref 0.0–0.2)
nRBC: 0 % (ref 0.0–0.2)

## 2019-01-17 LAB — ABO/RH: ABO/RH(D): A POS

## 2019-01-17 MED ORDER — ZOLPIDEM TARTRATE 5 MG PO TABS: 5.0000 mg | ORAL_TABLET | Freq: Every evening | ORAL | Status: DC | PRN

## 2019-01-17 MED ORDER — EPHEDRINE 5 MG/ML INJ: 10.0000 mg | INTRAVENOUS | Status: DC | PRN

## 2019-01-17 MED ORDER — LACTATED RINGERS IV SOLN: 500.0000 mL | INTRAVENOUS | Status: DC | PRN

## 2019-01-17 MED ORDER — LIDOCAINE HCL (PF) 1 % IJ SOLN
INTRAMUSCULAR | Status: DC | PRN
  Administered 2019-01-17: 4 mL via EPIDURAL
  Administered 2019-01-17: 4 mL

## 2019-01-17 MED ORDER — SODIUM CHLORIDE (PF) 0.9 % IJ SOLN
INTRAMUSCULAR | Status: DC | PRN
  Administered 2019-01-17: 12.5 mL/h via EPIDURAL

## 2019-01-17 MED ORDER — PHENYLEPHRINE 40 MCG/ML (10ML) SYRINGE FOR IV PUSH (FOR BLOOD PRESSURE SUPPORT): 80.0000 ug | PREFILLED_SYRINGE | INTRAVENOUS | Status: DC | PRN

## 2019-01-17 MED ORDER — LACTATED RINGERS IV SOLN: 500.0000 mL | Freq: Once | INTRAVENOUS | Status: DC

## 2019-01-17 MED ORDER — LACTATED RINGERS IV SOLN
INTRAVENOUS | Status: DC
  Administered 2019-01-17 (×3): via INTRAVENOUS

## 2019-01-17 MED ORDER — MISOPROSTOL 25 MCG QUARTER TABLET
25.0000 ug | ORAL_TABLET | ORAL | Status: DC | PRN
  Administered 2019-01-17 (×2): 25 ug via VAGINAL
  Filled 2019-01-17 (×2): qty 1

## 2019-01-17 MED ORDER — OXYCODONE-ACETAMINOPHEN 5-325 MG PO TABS: 2.0000 | ORAL_TABLET | ORAL | Status: DC | PRN

## 2019-01-17 MED ORDER — OXYTOCIN 40 UNITS IN NORMAL SALINE INFUSION - SIMPLE MED: 2.5000 [IU]/h | INTRAVENOUS | Status: DC

## 2019-01-17 MED ORDER — ACETAMINOPHEN 325 MG PO TABS: 650.0000 mg | ORAL_TABLET | ORAL | Status: DC | PRN

## 2019-01-17 MED ORDER — PENICILLIN G POT IN DEXTROSE 60000 UNIT/ML IV SOLN
3.0000 10*6.[IU] | INTRAVENOUS | Status: DC
  Administered 2019-01-17 (×4): 3 10*6.[IU] via INTRAVENOUS
  Filled 2019-01-17 (×4): qty 50

## 2019-01-17 MED ORDER — OXYTOCIN 40 UNITS IN NORMAL SALINE INFUSION - SIMPLE MED
1.0000 m[IU]/min | INTRAVENOUS | Status: DC
  Administered 2019-01-17: 2 m[IU]/min via INTRAVENOUS
  Filled 2019-01-17: qty 1000

## 2019-01-17 MED ORDER — TERBUTALINE SULFATE 1 MG/ML IJ SOLN: 0.2500 mg | Freq: Once | INTRAMUSCULAR | Status: DC | PRN

## 2019-01-17 MED ORDER — LACTATED RINGERS IV SOLN
500.0000 mL | Freq: Once | INTRAVENOUS | Status: AC
  Administered 2019-01-17: 500 mL via INTRAVENOUS

## 2019-01-17 MED ORDER — BUTORPHANOL TARTRATE 1 MG/ML IJ SOLN
1.0000 mg | INTRAMUSCULAR | Status: DC | PRN
  Filled 2019-01-17: qty 1

## 2019-01-17 MED ORDER — HYDROXYZINE HCL 50 MG PO TABS
50.0000 mg | ORAL_TABLET | Freq: Four times a day (QID) | ORAL | Status: DC | PRN
  Administered 2019-01-17: 50 mg via ORAL
  Filled 2019-01-17: qty 1

## 2019-01-17 MED ORDER — FENTANYL-BUPIVACAINE-NACL 0.5-0.125-0.9 MG/250ML-% EP SOLN
12.0000 mL/h | EPIDURAL | Status: DC | PRN
  Filled 2019-01-17: qty 250

## 2019-01-17 MED ORDER — SOD CITRATE-CITRIC ACID 500-334 MG/5ML PO SOLN: 30.0000 mL | ORAL | Status: DC | PRN

## 2019-01-17 MED ORDER — ONDANSETRON HCL 4 MG/2ML IJ SOLN: 4.0000 mg | Freq: Four times a day (QID) | INTRAMUSCULAR | Status: DC | PRN

## 2019-01-17 MED ORDER — OXYCODONE-ACETAMINOPHEN 5-325 MG PO TABS: 1.0000 | ORAL_TABLET | ORAL | Status: DC | PRN

## 2019-01-17 MED ORDER — SODIUM CHLORIDE 0.9 % IV SOLN
5.0000 10*6.[IU] | Freq: Once | INTRAVENOUS | Status: AC
  Administered 2019-01-17: 5 10*6.[IU] via INTRAVENOUS
  Filled 2019-01-17: qty 5

## 2019-01-17 MED ORDER — LIDOCAINE HCL (PF) 1 % IJ SOLN: 30.0000 mL | INTRAMUSCULAR | Status: DC | PRN

## 2019-01-17 MED ORDER — OXYTOCIN BOLUS FROM INFUSION
500.0000 mL | Freq: Once | INTRAVENOUS | Status: AC
  Administered 2019-01-17: 500 mL/h via INTRAVENOUS

## 2019-01-17 MED ORDER — DIPHENHYDRAMINE HCL 50 MG/ML IJ SOLN: 12.5000 mg | INTRAMUSCULAR | Status: DC | PRN

## 2019-01-17 NOTE — Progress Notes (Signed)
Doing well. No updates to H&P.  SVE 3.5/60/-2, AROM for clear fluid EFW 7#8 S/p cytotec x 2 Initiate pitocin S/p PCN x 2 doses   Arty Baumgartner MD

## 2019-01-17 NOTE — Progress Notes (Signed)
9/c/0

## 2019-01-17 NOTE — Anesthesia Preprocedure Evaluation (Signed)
Anesthesia Evaluation  Patient identified by MRN, date of birth, ID band Patient awake    Reviewed: Allergy & Precautions, Patient's Chart, lab work & pertinent test results  Airway Mallampati: II  TM Distance: >3 FB Neck ROM: Full    Dental no notable dental hx. (+) Teeth Intact   Pulmonary asthma , pneumonia, resolved,    Pulmonary exam normal breath sounds clear to auscultation       Cardiovascular negative cardio ROS Normal cardiovascular exam Rhythm:Regular Rate:Normal     Neuro/Psych negative neurological ROS  negative psych ROS   GI/Hepatic Neg liver ROS, GERD  Medicated and Controlled,  Endo/Other  negative endocrine ROS  Renal/GU negative Renal ROS  negative genitourinary   Musculoskeletal negative musculoskeletal ROS (+)   Abdominal   Peds  Hematology  (+) anemia ,   Anesthesia Other Findings   Reproductive/Obstetrics (+) Pregnancy                             Anesthesia Physical Anesthesia Plan  ASA: II  Anesthesia Plan: Epidural   Post-op Pain Management:    Induction:   PONV Risk Score and Plan:   Airway Management Planned: Natural Airway  Additional Equipment:   Intra-op Plan:   Post-operative Plan:   Informed Consent: I have reviewed the patients History and Physical, chart, labs and discussed the procedure including the risks, benefits and alternatives for the proposed anesthesia with the patient or authorized representative who has indicated his/her understanding and acceptance.       Plan Discussed with: Anesthesiologist  Anesthesia Plan Comments:         Anesthesia Quick Evaluation

## 2019-01-17 NOTE — Progress Notes (Signed)
C/c/+1  Rotated from OP --> LOA Pushing with good effort Continue to push

## 2019-01-17 NOTE — Progress Notes (Signed)
SVE 5.5/90/-1 by RN Late entry - patient seen prior to that and IUPC placed Continue pitocin   Arty Baumgartner MD

## 2019-01-17 NOTE — Anesthesia Procedure Notes (Addendum)
Epidural Patient location during procedure: OB Start time: 01/17/2019 11:52 AM End time: 01/17/2019 12:00 PM  Staffing Anesthesiologist: Josephine Igo, MD Performed: anesthesiologist   Preanesthetic Checklist Completed: patient identified, site marked, surgical consent, pre-op evaluation, timeout performed, IV checked, risks and benefits discussed and monitors and equipment checked  Epidural Patient position: sitting Prep: site prepped and draped and DuraPrep Patient monitoring: continuous pulse ox and blood pressure Approach: midline Location: L3-L4 Injection technique: LOR air  Needle:  Needle type: Tuohy  Needle gauge: 17 G Needle length: 9 cm and 9 Needle insertion depth: 5 cm cm Catheter type: closed end flexible Catheter size: 19 Gauge Catheter at skin depth: 10 cm Test dose: negative and Other  Assessment Events: blood not aspirated, injection not painful, no injection resistance, negative IV test and no paresthesia  Additional Notes Patient identified. Risks and benefits discussed including failed block, incomplete  Pain control, post dural puncture headache, nerve damage, paralysis, blood pressure Changes, nausea, vomiting, reactions to medications-both toxic and allergic and post Partum back pain. All questions were answered. Patient expressed understanding and wished to proceed. Sterile technique was used throughout procedure. Epidural site was Dressed with sterile barrier dressing. No paresthesias, signs of intravascular injection Or signs of intrathecal spread were encountered.  Patient was more comfortable after the epidural was dosed. Please see RN's note for documentation of vital signs and FHR which are stable. Reason for block:procedure for pain

## 2019-01-18 ENCOUNTER — Encounter (HOSPITAL_COMMUNITY): Payer: Self-pay

## 2019-01-18 LAB — CBC
HCT: 30.7 % — ABNORMAL LOW (ref 36.0–46.0)
Hemoglobin: 10.1 g/dL — ABNORMAL LOW (ref 12.0–15.0)
MCH: 30.2 pg (ref 26.0–34.0)
MCHC: 32.9 g/dL (ref 30.0–36.0)
MCV: 91.9 fL (ref 80.0–100.0)
Platelets: 113 10*3/uL — ABNORMAL LOW (ref 150–400)
RBC: 3.34 MIL/uL — ABNORMAL LOW (ref 3.87–5.11)
RDW: 12.2 % (ref 11.5–15.5)
WBC: 12 10*3/uL — ABNORMAL HIGH (ref 4.0–10.5)
nRBC: 0 % (ref 0.0–0.2)

## 2019-01-18 MED ORDER — TETANUS-DIPHTH-ACELL PERTUSSIS 5-2.5-18.5 LF-MCG/0.5 IM SUSP: 0.5000 mL | Freq: Once | INTRAMUSCULAR | Status: DC

## 2019-01-18 MED ORDER — ACETAMINOPHEN 325 MG PO TABS
650.0000 mg | ORAL_TABLET | ORAL | Status: DC | PRN
  Administered 2019-01-18: 650 mg via ORAL
  Filled 2019-01-18: qty 2

## 2019-01-18 MED ORDER — SIMETHICONE 80 MG PO CHEW: 80.0000 mg | CHEWABLE_TABLET | ORAL | Status: DC | PRN

## 2019-01-18 MED ORDER — WITCH HAZEL-GLYCERIN EX PADS: 1.0000 "application " | MEDICATED_PAD | CUTANEOUS | Status: DC | PRN

## 2019-01-18 MED ORDER — ONDANSETRON HCL 4 MG PO TABS: 4.0000 mg | ORAL_TABLET | ORAL | Status: DC | PRN

## 2019-01-18 MED ORDER — ZOLPIDEM TARTRATE 5 MG PO TABS: 5.0000 mg | ORAL_TABLET | Freq: Every evening | ORAL | Status: DC | PRN

## 2019-01-18 MED ORDER — OXYCODONE HCL 5 MG PO TABS
5.0000 mg | ORAL_TABLET | ORAL | Status: DC | PRN
  Administered 2019-01-18: 5 mg via ORAL
  Filled 2019-01-18: qty 1

## 2019-01-18 MED ORDER — DIPHENHYDRAMINE HCL 25 MG PO CAPS: 25.0000 mg | ORAL_CAPSULE | Freq: Four times a day (QID) | ORAL | Status: DC | PRN

## 2019-01-18 MED ORDER — IBUPROFEN 600 MG PO TABS
600.0000 mg | ORAL_TABLET | Freq: Four times a day (QID) | ORAL | Status: DC
  Administered 2019-01-18 – 2019-01-19 (×7): 600 mg via ORAL
  Filled 2019-01-18 (×8): qty 1

## 2019-01-18 MED ORDER — SENNOSIDES-DOCUSATE SODIUM 8.6-50 MG PO TABS
2.0000 | ORAL_TABLET | ORAL | Status: DC
  Administered 2019-01-19: 2 via ORAL
  Filled 2019-01-18: qty 2

## 2019-01-18 MED ORDER — COCONUT OIL OIL
1.0000 "application " | TOPICAL_OIL | Status: DC | PRN
  Administered 2019-01-18: 1 via TOPICAL

## 2019-01-18 MED ORDER — ONDANSETRON HCL 4 MG/2ML IJ SOLN: 4.0000 mg | INTRAMUSCULAR | Status: DC | PRN

## 2019-01-18 MED ORDER — OXYCODONE HCL 5 MG PO TABS: 10.0000 mg | ORAL_TABLET | ORAL | Status: DC | PRN

## 2019-01-18 MED ORDER — BENZOCAINE-MENTHOL 20-0.5 % EX AERO
1.0000 "application " | INHALATION_SPRAY | CUTANEOUS | Status: DC | PRN
  Filled 2019-01-18: qty 56

## 2019-01-18 MED ORDER — PRENATAL MULTIVITAMIN CH
1.0000 | ORAL_TABLET | Freq: Every day | ORAL | Status: DC
  Administered 2019-01-18: 1 via ORAL
  Filled 2019-01-18 (×2): qty 1

## 2019-01-18 MED ORDER — DIBUCAINE (PERIANAL) 1 % EX OINT: 1.0000 "application " | TOPICAL_OINTMENT | CUTANEOUS | Status: DC | PRN

## 2019-01-18 NOTE — Progress Notes (Signed)
CSW acknowledged consult and screening out referral. Per chart review, history of substance use entered in MOB's chart in error. CSW confirmed with RN that MOB does not have a history of substance use and does not require a social work consult.   Abundio Miu, [place] Worker [facility] Cell#: [phone]

## 2019-01-18 NOTE — Lactation Note (Signed)
This note was copied from a baby's chart. Lactation Consultation Note  Patient Name: Alison Harrison [id number] Date: 01/18/2019 Reason for consult: Follow-up assessment;Term;Primapara;1st time breastfeeding  P1 mother whose infant is now 59 hours old.  Baby was swaddled and sleeping in the bassinet when I arrived.  Mother expressed that she has been concerned because baby has been so sleepy.  Reassured parents that being sleepy the first 24 hours after birth is very common.  Educated on breast feeding basics such as STS, feeding cues, hand expression and finger feeding, how to obtain and maintain a good latch, how to awaken a sleepy baby and knowing when to call for latch assistance.  Mother comforted knowing this basic information.  She was given breast shells and a manual pump early this morning and had no questions about using these tools.  Encouraged to continue feeding 8-12 times/24 hours or sooner if baby shows feeding cues.  Reviewed cues.  Mother will continue hand expression before/after feedings to help increase milk supply.  She will finger feed/spoon feed any EBM she obtains to baby.  Mother has a DEBP for home use.  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  Father is also working from home.  NT in to room for baby's bath and will return now that I have finished visiting with the parents.  Informed mother that she will be doing STS after the bath for an hour, however, if baby begins to cue she will call for latch assistance.  Father present and supportive.   Maternal Data Formula Feeding for Exclusion: No Has patient been taught Hand Expression?: Yes Does the patient have breastfeeding experience prior to this delivery?: No  Feeding Feeding Type: Breast Fed  LATCH Score                   Interventions    Lactation Tools Discussed/Used WIC Program: No   Consult Status Consult Status: Follow-up Date:  01/19/19 Follow-up type: In-patient    Jeanee Fabre R Charman Blasco 01/18/2019, 11:11 AM

## 2019-01-18 NOTE — Anesthesia Postprocedure Evaluation (Signed)
Anesthesia Post Note  Patient: Alison Harrison  Procedure(s) Performed: AN AD [place]     Patient location during evaluation: Mother Baby Anesthesia Type: Epidural Level of consciousness: awake and alert, oriented and patient cooperative Pain management: pain level controlled Vital Signs Assessment: post-procedure vital signs reviewed and stable Respiratory status: spontaneous breathing Cardiovascular status: stable Postop Assessment: no headache, epidural receding, no signs of nausea or vomiting and able to ambulate Anesthetic complications: no Comments: Pt. States she is walking.  Pain score 4 and pt. States pain is manageable.     Last Vitals:  Vitals:   01/18/19 0414 01/18/19 0532  BP: 128/80 128/84  Pulse:  88  Resp:  17  Temp:  36.7 C  SpO2:      Last Pain:  Vitals:   01/18/19 0532  TempSrc: Axillary  PainSc: 0-No pain   Pain Goal:                   [facility]

## 2019-01-18 NOTE — Progress Notes (Addendum)
An error was found in her social history in her chart. It came up showing "she reported drug use" but the patient stated this is incorrect. Furthermore, she has been asked this question in our office multiple times and has always answered no. Alison Harrison states that she has never done drugs - not in pregnancy or ever. I reported this to her nurse and the social history was changed.     Arty Baumgartner MD

## 2019-01-18 NOTE — Progress Notes (Signed)
Post Partum Day 1 Subjective: no complaints, up ad lib, voiding and tolerating PO  Objective: Blood pressure 128/84, pulse 88, temperature 98 F (36.7 C), temperature source Axillary, resp. rate 17, height 5\' 7"  (1.702 m), weight 83.8 kg, SpO2 99 %, unknown if currently breastfeeding.  Physical Exam:  General: alert, cooperative and appears stated age Lochia: appropriate Uterine Fundus: firm Incision: healing well, no significant drainage, no dehiscence, no significant erythema DVT Evaluation: No evidence of DVT seen on physical exam. Negative Homan's sign. No cords or calf tenderness. No significant calf/ankle edema.  Recent Labs    01/17/19 0004 01/17/19 1107  HGB 11.0* 11.4*  HCT 32.9* 34.0*    Assessment/Plan: Plan for discharge tomorrow, Breastfeeding and Circumcision prior to discharge  D/W patient female infant circumcision, risks/benefits reviewed. All questions answered.    LOS: 1 day   Tyson Dense 01/18/2019, 7:58 AM

## 2019-01-18 NOTE — Lactation Note (Signed)
This note was copied from a baby's chart. Lactation Consultation Note  Patient Name: Alison Harrison [id number] Date: 01/18/2019 Reason for consult: Initial assessment;1st time breastfeeding;Term P1, 5 hour female infant. Per mom, she has medela DEBP at home. Per mom, infant only latched less than one minute in L&D.  LC entered room,  Dad was doing STS with infant. Tools: breast shells given by [facility] and hand pump by Nurse due to mom having flat nipples when compressed. Se[place]our asked mom to pre-pump with hand pump prior to latching infant at breast. Mom attempted to latch infant in football on right breast but did not sustain latch, mom switched to cross cradle position and infant was on and off breast for 7 minutes. Mom taught back hand expression and infant was given 5 ml of EBM by spoon. Mom will continue to work towards latching infant on breast. Mom was given breast shells to wear in bra to help evert nipple shaft out more, mom knows to wear breast shells in bra during the day and not sleep in them at night.  Mom knows to call Nurse or [facility] for assistance with latching infant to breast.  Mom knows to breastfeed according to hunger cues, 8 to 12 times within 24 hours and on demand. Reviewed Baby & Me book's Breastfeeding Basics.  Mom made aware of O/P services, breastfeeding support groups, community resources, and our phone # for post-discharge questions.   Maternal Data Formula Feeding for Exclusion: No Has patient been taught Hand Expression?: Yes(Infant was given 5 ml of colostrum on spoon.)  Feeding Feeding Type: Breast Fed  LATCH Score Latch: Repeated attempts needed to sustain latch, nipple held in mouth throughout feeding, stimulation needed to elicit sucking reflex.  Audible Swallowing: A few with stimulation  Type of Nipple: Flat  Comfort (Breast/Nipple): Soft / non-tender  Hold (Positioning): Assistance needed to correctly position infant at breast and maintain latch.  LATCH  Score: 6  Interventions Interventions: Breast feeding basics reviewed;Breast compression;Adjust position;Assisted with latch;Skin to skin;Support pillows;Hand pump;Position options;Breast massage;Hand express;Pre-pump if needed;Expressed milk;Shells  Lactation Tools Discussed/Used Tools: Shells;Pump Shell Type: Other (comment)(flat nipples when stimulated) Pump Review: Setup, frequency, and cleaning;Milk Storage Initiated by:: by Nurse Date initiated:: 01/18/19   Consult Status Consult Status: Follow-up Date: 01/18/19 Follow-up type: In-patient    Vicente Serene 01/18/2019, 3:39 AM

## 2019-01-19 ENCOUNTER — Ambulatory Visit: Payer: Self-pay

## 2019-01-19 NOTE — Lactation Note (Addendum)
This note was copied from a baby's chart. Lactation Consultation Note  Patient Name: Alison Harrison [id number] Date: 01/19/2019 Reason for consult: Follow-up assessment   P1, Baby 54 hours old.  Baby recently circumcised.  Attempted latching.  Reviewed depth and "U" shaped hold. Mother has abrasion and bruise around R nipple and edema ring on L nipple possibly from shell. Suggest not wearing shells for now.  Mother has a shorter shaft nipple on L side. Provided comfort gels and suggest mother put on non underwire bra.   Baby latched briefly in cross cradle and then fell back asleep. Parents asked if a nipple shield would help. Reviewed how to use #20NS and provided family with #24NS also.  Reminded mother to post pump if using NS 4-6 times a day. Reviewed giving volume back to baby with syringe.  Suggest calling when baby wakes to breastfeed. Reviewed engorgement care and monitoring voids/stools. Feed on demand with cues.  Goal 8-12+ times per day after first 24 hrs.  Place baby STS if not cueing.  Reviewed OP resources.   Returned to room to view latch.  He recently bf on the L breast for 30 min. Mother latched baby on R side with frequent swallows.   Encouraged her to compress breast to keep him active.    Maternal Data    Feeding Feeding Type: Breast Fed  LATCH Score                   Interventions Interventions: Breast feeding basics reviewed;Comfort gels  Lactation Tools Discussed/Used     Consult Status Consult Status: Follow-up Date: 01/19/19 Follow-up type: In-patient    Vivianne Master [facility] 01/19/2019, 9:34 AM

## 2019-01-19 NOTE — Lactation Note (Signed)
This note was copied from a baby's chart. Lactation Consultation Note  Patient Name: Boy Aime Ener [id number] Date: 01/19/2019 Reason for consult: Follow-up assessment;Mother's request;Difficult latch;1st time breastfeeding;Primapara(elevated bilirubin)  1957 - 2024 - I responded to Ms. Wolden's request for lactation support. When I entered the room, she was holding baby, Marijean Bravo, in cradle hold at the breast while STS. He was crying. We attempted to latch him, but he would not calm down enough to grasp the breast. I then placed him skin to skin on her chest with little change. He continued to cry.  I then swaddled the baby in his bassinet. Dad "shhhh'd" while I gently rocked baby and allowed him to suckle on a gloved finger. He calmed down. We lowered the lights and turned off the tv, and he remained calm and yawned.  Ms. Ruzzo stated that baby had had a big day with lots of visits, circumcisions, and checks. She states that he was breast feeding well today, and it was just this last hour that he has been crying. Otherwise, they state that he has been a calm baby. I encouraged Ms. Ewing to pump with her manual pump for 5-10 minutes on each breast. I helped her initiate pumping. I reviewed milk storage guidelines and suggested that she offer back any EBM to baby by cup or spoon.  I recommended latching baby when he cued again. I also suggested that if baby continued to be difficult to console and refuse the breast over night that she could supplement a small amount of formula via cup or spoon. I recommended breast feeding first, and I recommended that she continue to use her manual pump and feed her EBM first.  Ms. Freye verbalized understanding. She feels that her breasts are changing, and we discussed that her milk may be transitioning. When we hand expressed, I noted colostrum, but it expressed easily.  Maternal Data Formula Feeding for Exclusion: No Has patient been taught Hand  Expression?: Yes Does the patient have breastfeeding experience prior to this delivery?: No  Feeding Feeding Type: Breast Fed  LATCH Score Latch: Too sleepy or reluctant, no latch achieved, no sucking elicited.  Audible Swallowing: None  Type of Nipple: Everted at rest and after stimulation  Comfort (Breast/Nipple): Filling, red/small blisters or bruises, mild/mod discomfort  Hold (Positioning): Assistance needed to correctly position infant at breast and maintain latch.  LATCH Score: 4  Interventions Interventions: Breast feeding basics reviewed;Assisted with latch;Skin to skin;Hand express;Hand pump  Lactation Tools Discussed/Used Tools: Other (comment)(manual pump) Shell Type: Inverted Breast pump type: Manual Pump Review: Setup, frequency, and cleaning;Milk Storage   Consult Status Consult Status: Follow-up Date: 01/20/19 Follow-up type: Call as needed    Lenore Manner 01/19/2019, 8:26 PM

## 2019-01-19 NOTE — Discharge Summary (Signed)
Obstetric Discharge Summary Reason for Admission: induction of labor Prenatal Procedures: none Intrapartum Procedures: spontaneous vaginal delivery Postpartum Procedures: none Complications-Operative and Postpartum: 2nd degree perineal laceration Hemoglobin  Date Value Ref Range Status  01/18/2019 10.1 (L) 12.0 - 15.0 g/dL Final   HCT  Date Value Ref Range Status  01/18/2019 30.7 (L) 36.0 - 46.0 % Final    Physical Exam:  General: alert, cooperative and appears stated age 29: appropriate Uterine Fundus: firm Incision: healing well, no significant drainage, no dehiscence, no significant erythema DVT Evaluation: No evidence of DVT seen on physical exam. Negative Homan's sign. No cords or calf tenderness. No significant calf/ankle edema.  Discharge Diagnoses: Term Pregnancy-delivered  Discharge Information: Date: 01/19/2019 Activity: pelvic rest Diet: routine Medications: PNV Condition: stable Instructions: refer to practice specific booklet Discharge to: home   Newborn Data: Live born female  Birth Weight: 7 lb 11.6 oz (3504 g) APGAR: 8, 9  Newborn Delivery   Birth date/time: 01[birth year]/2019 22:36:00 Delivery type: Vaginal, Spontaneous      Home with mother.  Alison Harrison 01/19/2019, 9:20 AM

## 2019-01-19 NOTE — Lactation Note (Signed)
This note was copied from a baby's chart. Lactation Consultation Note  Patient Name: Alison Harrison [id number] Date: 01/19/2019  P1, 74 hour female infant, weight loss -1%. LC entered the room mom and infant asleep at this time.   Maternal Data    Feeding    LATCH Score                   Interventions    Lactation Tools Discussed/Used     Consult Status      Vicente Serene 01/19/2019, 3:22 AM

## 2019-01-20 ENCOUNTER — Ambulatory Visit: Payer: Self-pay

## 2019-01-20 NOTE — Lactation Note (Signed)
This note was copied from a baby's chart. Lactation Consultation Note  Patient Name: Alison Harrison [id number] Date: 01/20/2019 Reason for consult: Follow-up assessment;Term;Primapara;1st time breastfeeding;Infant weight loss  P1 mother whose infant is now 81 hours old.  Baby has a 9% weight loss this a.m.  Mother had baby latched to the right breast in the cradle hold when I arrived; stated he had been feeding for 25 minutes prior to my arrival.  Randel Books was very sleepy and I suggested removing him and trying to burp.  After burping he began showing feeding cues and mother attempted to get him to latch again without success.  Baby rooting assertively with a wide open gape but would not close mouth to latch.  I offered to do suck training with him while mother did some hand expression.  Mother agreeable.  On my gloved finger baby was having difficulty closing mouth to obtain a firm grasp and suck.  He wanted to move mouth constantly with a wide gape.  With cheek and jaw support he began to obtain a firm grasp.  Continued to work with him for 10 minutes while mother hand express into a spoon and periodically fed him the drops.  I continued working with him for an additional 5 minutes while reviewing breast feeding basics with mother.  She was able to obtain 5 mls and he calmed down.  Mother attempted to latch him back to the breast but, by this time, he was tired.  He burped well and mother held him STS.  He remained calm and sleepy.  Reinforced the importance of making feeding a top priority after discharge today.  Advised mother to feed him at least every three hours but more often as desired.  Encouraged hand expression before/after feedings with any EBM being fed back to baby via spoon to obtain extra calories.  Suggested she keep him actively awake and engaged at the breast even if she has to provide a lot of gentle stimulation to keep him sucking.  Mother verbalized understanding.  She has our phone  number for questions after discharge and an OP [place] visit at her pediatrician's office.  Offered our [facility] services as well.  Father present and very supportive.  Mother has a DEBP for home use.    Engorgement prevention/treatment reviewed.  Mother has a manual pump and has no questions on usage.  She will call for further assistance as needed prior to discharge.   Maternal Data    Feeding    LATCH Score                   Interventions    Lactation Tools Discussed/Used     Consult Status Consult Status: Complete Date: 01/20/19 Follow-up type: Call as needed    Alison Harrison 01/20/2019, 10:22 AM

## 2019-10-03 ENCOUNTER — Telehealth (INDEPENDENT_AMBULATORY_CARE_PROVIDER_SITE_OTHER): Payer: Self-pay | Admitting: Internal Medicine

## 2019-10-03 ENCOUNTER — Encounter: Payer: Self-pay | Admitting: Internal Medicine

## 2019-10-03 VITALS — Ht 67.0 in | Wt 132.0 lb

## 2019-10-03 DIAGNOSIS — J321 Chronic frontal sinusitis: Secondary | ICD-10-CM

## 2019-10-03 DIAGNOSIS — S0300XD Dislocation of jaw, unspecified side, subsequent encounter: Secondary | ICD-10-CM

## 2019-10-03 DIAGNOSIS — S0300XA Dislocation of jaw, unspecified side, initial encounter: Secondary | ICD-10-CM

## 2019-10-03 DIAGNOSIS — J029 Acute pharyngitis, unspecified: Secondary | ICD-10-CM

## 2019-10-03 HISTORY — DX: Dislocation of jaw, unspecified side, initial encounter: S03.00XA

## 2019-10-03 MED ORDER — AMOXICILLIN 875 MG PO TABS: 875.0000 mg | ORAL_TABLET | Freq: Two times a day (BID) | ORAL | 0 refills | Status: DC

## 2019-10-03 MED ORDER — SALINE SPRAY 0.65 % NA SOLN: 1.0000 | NASAL | 2 refills | Status: DC | PRN

## 2019-10-03 NOTE — Progress Notes (Signed)
Patient presenting with jaw locking up and sinus pain.  Has been having runny nose and sick symptoms. Worsened yesterday with headache and sore throat. Green mucous and nasal congestion. Son (9 months) has been sick but is doing better, son is not in daycare.  Has not been tested for COVID. No fever.  Was on vacation last week with the entire family. Patient's husband, sister, and nephew are sick as well.   Yesterday morning jaw was locked. Has loss full range of motion. Thinks its due to sleeping with mouth open due to nasal congestion.

## 2019-10-03 NOTE — Patient Instructions (Addendum)
Also warm salt water gargles or cepacol candy for sore throat over the counter  Warm tea with honey and lemon  Consider flexeril for TMJ if you stop breast feeding and discuss with dental   Jaw Range of Motion Exercises Jaw range of motion exercises are exercises that help your jaw move better. Exercises that help you have good posture (postural exercises) also help relieve jaw discomfort. These are often done along with range of motion exercises. These exercises can help prevent or improve:  Difficulty opening your mouth.  Pain in your jaw while it is open or closed.  Temporomandibular joint (TMJ) pain.  Headache caused by jaw tension. Take other actions to prevent or relieve jaw pain, such as:  Avoiding things that cause or increase jaw pain. This may include: ? Chewing gum or eating hard foods. ? Clenching your jaw or teeth, grinding your teeth, or keeping tension in your jaw muscles. ? Opening your mouth wide, such as for a big yawn. ? Leaning on your jaw, such as resting your jaw in your hand while leaning on a desk.  Putting ice on your jaw. ? Put ice in a plastic bag. ? Place a towel between your skin and the bag. ? Leave the ice on for 10-15 minutes, 2-3 times a day. Only do jaw exercises that your health care provider approves of. Only move your jaw as far as it can comfortably go in each direction. Do not move your jaw into positions that cause pain. Range of motion exercises Repeat each of these exercises 8 times, 1-2 times a day, or as told by your health care provider. Exercise A: Forward protrusion 1. Push your jaw forward. Hold this position for 1-2 seconds. 2. Allow your jaw to return to its normal position and rest it there for 1-2 seconds. Exercise B: Controlled opening 1. Stand or sit in front of a mirror. Place your tongue on the roof of your mouth, just behind your top teeth. 2. Keeping your tongue on the roof of your mouth, slowly open and close your  mouth. 3. While you open and close your mouth, watch your jaw in the mirror. Try to keep your jaw from moving to one side or the other. Exercise C: Right and left motion 1. Move your jaw right. Hold this position for 1-2 seconds. Allow your jaw to return to its normal position, and rest it there for 1-2 seconds. 2. Move your jaw left. Hold this position for 1-2 seconds. Allow your jaw to return to its normal position, and rest it there for 1-2 seconds. Postural exercises Exercise A: Chin tucks 1. You can do this exercise sitting, standing, or lying down. 2. Move your head straight back, keeping your head level. You can guide the movement by placing your fingers on your chin to push your jaw back in an even motion. You should be able to feel a double chin form at the end of the motion. 3. Hold this position for 5 seconds. Repeat 10-15 times. Exercise B: Shoulder blade squeeze 1. Sit or stand. 2. Bend your elbows to about 90 degrees, which is the shape of a capital letter "L." Keep your upper arms by your body. 3. Squeeze your shoulder blades down and back, as though you were trying to touch your elbows behind you. Do not shrug your shoulders or move your head. 4. Hold this position for 5 seconds. Repeat 10-15 times. Exercise C: Chest stretch 1. Stand facing a corner. 2. Put  both of your hands and your forearms on the wall, with your arms wide apart. 3. Make sure your arms are at a 90-degree angle to your body. This means that you should hold your arms straight out from your body, level with the floor. 4. Step in toward the corner. Do not lean in. 5. Hold this position for 30 seconds. Repeat 3 times. Contact a health care provider if you have:  Jaw pain that is new or gets worse.  Clicking or popping sounds while doing the exercises. Get help right away if:  Your jaw is stuck in one place and you cannot move it.  You cannot open or close your mouth. This information is not intended to  replace advice given to you by your health care provider. Make sure you discuss any questions you have with your health care provider. Document Revised: 06/14/2018 Document Reviewed: 01/17/2017 Elsevier Patient Education  [employer].

## 2019-10-03 NOTE — Progress Notes (Signed)
Virtual Visit via Video Note  I connected with Alison Harrison  on 10/03/19 at  1:00 PM EDT by a video enabled telemedicine application and verified that I am speaking with the correct person using two identifiers.  Location patient: home Location provider:work or home office Persons participating in the virtual visit: patient, provider  I discussed the limitations of evaluation and management by telemedicine and the availability of in person appointments. The patient expressed understanding and agreed to proceed.   HPI: Sick visit  1. C/o sore throat sinus pain x 2 days tried nyquil/dayquil son has been sick with URI and other family members. Taste intact, +nasal congestion, denies fever, +sob with exertion and feels weak but no cough her son has a cough. She tried advil prn as well for sinus pain   2. 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 She thinks since sick she has been snoring with mouth open    ROS: See pertinent positives and negatives per HPI.  Past Medical History:  Diagnosis Date  . Allergy   . Asthma    exercise induced  . Chicken pox   . GERD (gastroesophageal reflux disease)   . Pneumonia 06/2017    Past Surgical History:  Procedure Laterality Date  . WISDOM TOOTH EXTRACTION      Family History  Problem Relation Age of Onset  . Hypertension Mother   . Arthritis Maternal Grandmother   . Alcohol abuse Maternal Grandfather   . Diabetes Maternal Grandfather   . Cancer Paternal Grandmother        breast   . Cancer Paternal Grandfather        prostate   . Miscarriages / Stillbirths Sister     SOCIAL HX: son and husband   Current Outpatient Medications:  .  Prenatal Vit-Fe Fumarate-FA (PRENATAL MULTIVITAMIN) TABS tablet, Take 1 tablet by mouth daily at 12 noon., Disp: , Rfl:  .  amoxicillin (AMOXIL) 875 MG tablet, Take 1 tablet (875 mg total) by  mouth 2 (two) times daily. With food, Disp: 14 tablet, Rfl: 0 .  sodium chloride (OCEAN) 0.65 % SOLN nasal spray, Place 1 spray into both nostrils as needed for congestion., Disp: 30 mL, Rfl: 2  EXAM:  VITALS per patient if applicable:  GENERAL: alert, oriented, appears well and in no acute distress  HEENT: atraumatic, conjunttiva clear, no obvious abnormalities on inspection of external nose and ears  NECK: normal movements of the head and neck  LUNGS: on inspection no signs of respiratory distress, breathing rate appears normal, no obvious gross SOB, gasping or wheezing  CV: no obvious cyanosis  MS: moves all visible extremities without noticeable abnormality  PSYCH/NEURO: pleasant and cooperative, no obvious depression or anxiety, speech and thought processing grossly intact  ASSESSMENT AND PLAN:  Discussed the following assessment and plan:  Frontal sinusitis, unspecified chronicity/sore throat - Plan: amoxicillin (AMOXIL) 875 MG tablet bid x 1 week, sodium chloride (OCEAN) 0.65 % SOLN nasal spray Supportive care  rec covid 19 testing   Dislocation of temporomandibular joint, subsequent encounter Exercises  Consider flexeril prn if not breast feeding but currently is let me know  F/u dental   -we discussed possible serious and likely etiologies, options for evaluation and workup, limitations of telemedicine visit vs in person visit, treatment, treatment risks and precautions. Pt prefers to treat via telemedicine empirically rather  then risking or undertaking an in person visit at this moment. Patient agrees to seek prompt in person care if worsening, new symptoms arise, or if is not improving with treatment.   I discussed the assessment and treatment plan with the patient. The patient was provided an opportunity to ask questions and all were answered. The patient agreed with the plan and demonstrated an understanding of the instructions.   The patient was advised to call  back or seek an in-person evaluation if the symptoms worsen or if the condition fails to improve as anticipated.  Time spent 20 min Delorise Jackson, MD

## 2019-10-06 ENCOUNTER — Telehealth: Payer: Self-pay | Admitting: Internal Medicine

## 2019-10-06 NOTE — Telephone Encounter (Signed)
Lm on vm to call and make a CPE appointment/come fasting, per Dr. Aundra Dubin.

## 2020-03-10 LAB — OB RESULTS CONSOLE ABO/RH: RH Type: POSITIVE

## 2020-03-10 LAB — OB RESULTS CONSOLE HEPATITIS B SURFACE ANTIGEN: Hepatitis B Surface Ag: NEGATIVE

## 2020-03-10 LAB — OB RESULTS CONSOLE HIV ANTIBODY (ROUTINE TESTING): HIV: NONREACTIVE

## 2020-03-10 LAB — OB RESULTS CONSOLE ANTIBODY SCREEN: Antibody Screen: NEGATIVE

## 2020-03-10 LAB — OB RESULTS CONSOLE RUBELLA ANTIBODY, IGM: Rubella: IMMUNE

## 2020-03-10 LAB — OB RESULTS CONSOLE GC/CHLAMYDIA
Chlamydia: NEGATIVE
Gonorrhea: NEGATIVE

## 2020-03-10 LAB — OB RESULTS CONSOLE RPR: RPR: NONREACTIVE

## 2020-05-25 ENCOUNTER — Inpatient Hospital Stay (HOSPITAL_COMMUNITY)
Admission: AD | Admit: 2020-05-25 | Discharge: 2020-05-25 | Disposition: A | Discharge status: Home / Self Care | Payer: 59 | Attending: Obstetrics & Gynecology | Admitting: Obstetrics & Gynecology

## 2020-05-25 ENCOUNTER — Other Ambulatory Visit: Payer: Self-pay

## 2020-05-25 ENCOUNTER — Encounter (HOSPITAL_COMMUNITY): Payer: Self-pay | Admitting: Obstetrics & Gynecology

## 2020-05-25 DIAGNOSIS — Z3A22 22 weeks gestation of pregnancy: Secondary | ICD-10-CM | POA: Insufficient documentation

## 2020-05-25 DIAGNOSIS — O99612 Diseases of the digestive system complicating pregnancy, second trimester: Secondary | ICD-10-CM | POA: Diagnosis not present

## 2020-05-25 DIAGNOSIS — K529 Noninfective gastroenteritis and colitis, unspecified: Secondary | ICD-10-CM

## 2020-05-25 DIAGNOSIS — O218 Other vomiting complicating pregnancy: Secondary | ICD-10-CM | POA: Insufficient documentation

## 2020-05-25 DIAGNOSIS — Z20822 Contact with and (suspected) exposure to covid-19: Secondary | ICD-10-CM | POA: Diagnosis not present

## 2020-05-25 DIAGNOSIS — R197 Diarrhea, unspecified: Secondary | ICD-10-CM | POA: Diagnosis present

## 2020-05-25 LAB — SARS CORONAVIRUS 2 (TAT 6-24 HRS): SARS Coronavirus 2: NEGATIVE

## 2020-05-25 LAB — URINALYSIS, ROUTINE W REFLEX MICROSCOPIC
Bilirubin Urine: NEGATIVE
Glucose, UA: NEGATIVE mg/dL
Hgb urine dipstick: NEGATIVE
Ketones, ur: 20 mg/dL — AB
Nitrite: NEGATIVE
Protein, ur: 30 mg/dL — AB
Specific Gravity, Urine: 1.028 (ref 1.005–1.030)
pH: 5 (ref 5.0–8.0)

## 2020-05-25 LAB — CBC WITH DIFFERENTIAL/PLATELET
Abs Immature Granulocytes: 0.02 10*3/uL (ref 0.00–0.07)
Basophils Absolute: 0 10*3/uL (ref 0.0–0.1)
Basophils Relative: 0 %
Eosinophils Absolute: 0 10*3/uL (ref 0.0–0.5)
Eosinophils Relative: 0 %
HCT: 41.5 % (ref 36.0–46.0)
Hemoglobin: 14 g/dL (ref 12.0–15.0)
Immature Granulocytes: 0 %
Lymphocytes Relative: 3 %
Lymphs Abs: 0.2 10*3/uL — ABNORMAL LOW (ref 0.7–4.0)
MCH: 31 pg (ref 26.0–34.0)
MCHC: 33.7 g/dL (ref 30.0–36.0)
MCV: 92 fL (ref 80.0–100.0)
Monocytes Absolute: 0.2 10*3/uL (ref 0.1–1.0)
Monocytes Relative: 3 %
Neutro Abs: 6.6 10*3/uL (ref 1.7–7.7)
Neutrophils Relative %: 94 %
Platelets: 184 10*3/uL (ref 150–400)
RBC: 4.51 MIL/uL (ref 3.87–5.11)
RDW: 12.4 % (ref 11.5–15.5)
WBC: 7.1 10*3/uL (ref 4.0–10.5)
nRBC: 0 % (ref 0.0–0.2)

## 2020-05-25 LAB — BASIC METABOLIC PANEL
Anion gap: 7 (ref 5–15)
BUN: 14 mg/dL (ref 6–20)
CO2: 23 mmol/L (ref 22–32)
Calcium: 8.3 mg/dL — ABNORMAL LOW (ref 8.9–10.3)
Chloride: 103 mmol/L (ref 98–111)
Creatinine, Ser: 0.57 mg/dL (ref 0.44–1.00)
GFR, Estimated: >60 mL/min (ref >60)
Glucose, Bld: 101 mg/dL — ABNORMAL HIGH (ref 70–99)
Potassium: 3.3 mmol/L — ABNORMAL LOW (ref 3.5–5.1)
Sodium: 133 mmol/L — ABNORMAL LOW (ref 135–145)

## 2020-05-25 MED ORDER — SODIUM CHLORIDE 0.9 % IV SOLN
8.0000 mg | Freq: Once | INTRAVENOUS | Status: AC
  Administered 2020-05-25: 8 mg via INTRAVENOUS
  Filled 2020-05-25: qty 4

## 2020-05-25 MED ORDER — LOPERAMIDE HCL 2 MG PO CAPS
4.0000 mg | ORAL_CAPSULE | Freq: Once | ORAL | Status: AC
  Administered 2020-05-25: 4 mg via ORAL
  Filled 2020-05-25: qty 2

## 2020-05-25 MED ORDER — ONDANSETRON 4 MG PO TBDP: 4.0000 mg | ORAL_TABLET | Freq: Four times a day (QID) | ORAL | 0 refills | Status: DC | PRN

## 2020-05-25 MED ORDER — LOPERAMIDE HCL 2 MG PO CAPS: 2.0000 mg | ORAL_CAPSULE | Freq: Four times a day (QID) | ORAL | 0 refills | Status: DC | PRN

## 2020-05-25 MED ORDER — LACTATED RINGERS IV BOLUS
1000.0000 mL | Freq: Once | INTRAVENOUS | Status: AC
  Administered 2020-05-25: 1000 mL via INTRAVENOUS

## 2020-05-25 NOTE — MAU Note (Signed)
Woke up during the night, stomach hurting really bad.  Started throwing up, has continued with that.  Hasn't been able to keep water down.  Having watery stools also.

## 2020-05-25 NOTE — MAU Provider Note (Signed)
History     CSN: [id number]  Arrival date and time: 05/25/20 1338   Event Date/Time   First Provider Initiated Contact with Patient 05/25/20 1458      Chief Complaint  Patient presents with  . Abdominal Pain  . Emesis  . Diarrhea   Alison Harrison is a 30 y.o. G4P1021 at [redacted]w[redacted]d who receives care at [facility].  She presents today for Abdominal Pain, Emesis, and Diarrhea.  She states she started vomiting around 0200 and has had about 10 or more incidents since onset.  She reports she last threw up about one hour ago. She states the diarrhea started about 0500 and she has had about 4 bouts since onset.  She states the stool is "very watery... it's like pure water."  She states she tried to take tums, but threw them up immediately.  She states she has tried to eat "a handful of granola" prior to arrival, but threw up almost immediately after. She states she has been drinking water and gatorade, but soon after she vomits. Patient questions if her deli meat is the cause for her symptoms.   Dinner: Ovid Curd: Homemade Panini: Turkey-Deli, Tomato, Mozz, and Pesto  Snack: Lucendia Herrlich  Yogurt with berries and granola   Lunch: Ravioli-Stouffers 1/2 PBnJ  Snack: Cucumbers     OB History    Gravida  4   Para  1   Term  1   Preterm      AB  2   Living  1     SAB  2   IAB      Ectopic      Multiple  0   Live Births  1           Past Medical History:  Diagnosis Date  . Allergy   . Asthma    exercise induced  . Chicken pox   . GERD (gastroesophageal reflux disease)   . Pneumonia 06/2017    Past Surgical History:  Procedure Laterality Date  . WISDOM TOOTH EXTRACTION      Family History  Problem Relation Age of Onset  . Hypertension Mother   . Arthritis Maternal Grandmother   . Alcohol abuse Maternal Grandfather   . Diabetes Maternal Grandfather   . Cancer Paternal Grandmother        breast   . Cancer Paternal Grandfather        prostate   .  Miscarriages / [place] Sister     Social History   Tobacco Use  . Smoking status: Never Smoker  . Smokeless tobacco: Never Used  Vaping Use  . Vaping Use: Never used  Substance Use Topics  . Alcohol use: Not Currently  . Drug use: Never    Comment: husband     Allergies:  Allergies  Allergen Reactions  . Tetracycline Hives    Sob    Medications Prior to Admission  Medication Sig Dispense Refill Last Dose  . amoxicillin (AMOXIL) 875 MG tablet Take 1 tablet (875 mg total) by mouth 2 (two) times daily. With food 14 tablet 0   . Prenatal Vit-Fe Fumarate-FA (PRENATAL MULTIVITAMIN) TABS tablet Take 1 tablet by mouth daily at 12 noon.     . sodium chloride (OCEAN) 0.65 % SOLN nasal spray Place 1 spray into both nostrils as needed for congestion. 30 mL 2     Review of Systems  Constitutional: Positive for chills ("Times when I have felt really cold then get warm"). Negative for fever.  Respiratory: Negative for cough and shortness of breath.   Gastrointestinal: Positive for abdominal pain (Cramping prior to vomiting), diarrhea, nausea and vomiting. Negative for constipation.   Physical Exam   Blood pressure 117/72, pulse (!) 115, temperature 98.6 F (37 C), temperature source Oral, resp. rate 18, height 5\' 7"  (1.702 m), weight 64.9 kg, SpO2 98 %, currently breastfeeding.  Physical Exam Vitals reviewed.  Constitutional:      Appearance: She is well-developed.  HENT:     Head: Normocephalic and atraumatic.  Eyes:     Conjunctiva/sclera: Conjunctivae normal.  Cardiovascular:     Rate and Rhythm: Normal rate and regular rhythm.     Heart sounds: Normal heart sounds.  Pulmonary:     Effort: Pulmonary effort is normal. No respiratory distress.     Breath sounds: Normal breath sounds.  Abdominal:     Tenderness: There is no abdominal tenderness.     Comments: Gravid, AGA  Musculoskeletal:        General: Normal range of motion.     Cervical back: Normal range of  motion.  Skin:    General: Skin is warm and dry.     Coloration: Skin is pale.  Neurological:     Mental Status: She is alert and oriented to person, place, and time.  Psychiatric:        Mood and Affect: Mood normal.        Behavior: Behavior normal.        Thought Content: Thought content normal.     MAU Course  Procedures Results for orders placed or performed during the hospital encounter of 05/25/20 (from the past 24 hour(s))  Urinalysis, Routine w reflex microscopic Urine, Clean Catch     Status: Abnormal   Collection Time: 05/25/20  2:12 PM  Result Value Ref Range   Color, Urine YELLOW YELLOW   APPearance HAZY (A) CLEAR   Specific Gravity, Urine 1.028 1.005 - 1.030   pH 5.0 5.0 - 8.0   Glucose, UA NEGATIVE NEGATIVE mg/dL   Hgb urine dipstick NEGATIVE NEGATIVE   Bilirubin Urine NEGATIVE NEGATIVE   Ketones, ur 20 (A) NEGATIVE mg/dL   Protein, ur 30 (A) NEGATIVE mg/dL   Nitrite NEGATIVE NEGATIVE   Leukocytes,Ua TRACE (A) NEGATIVE   RBC / HPF 0-5 0 - 5 RBC/hpf   WBC, UA 6-10 0 - 5 WBC/hpf   Bacteria, UA FEW (A) NONE SEEN   Squamous Epithelial / LPF 0-5 0 - 5   Mucus PRESENT   CBC with Differential/Platelet     Status: Abnormal   Collection Time: 05/25/20  3:22 PM  Result Value Ref Range   WBC 7.1 4.0 - 10.5 K/uL   RBC 4.51 3.87 - 5.11 MIL/uL   Hemoglobin 14.0 12.0 - 15.0 g/dL   HCT 41.5 36.0 - 46.0 %   MCV 92.0 80.0 - 100.0 fL   MCH 31.0 26.0 - 34.0 pg   MCHC 33.7 30.0 - 36.0 g/dL   RDW 12.4 11.5 - 15.5 %   Platelets 184 150 - 400 K/uL   nRBC 0.0 0.0 - 0.2 %   Neutrophils Relative % 94 %   Neutro Abs 6.6 1.7 - 7.7 K/uL   Lymphocytes Relative 3 %   Lymphs Abs 0.2 (L) 0.7 - 4.0 K/uL   Monocytes Relative 3 %   Monocytes Absolute 0.2 0.1 - 1.0 K/uL   Eosinophils Relative 0 %   Eosinophils Absolute 0.0 0.0 - 0.5 K/uL   Basophils Relative 0 %  Basophils Absolute 0.0 0.0 - 0.1 K/uL   Immature Granulocytes 0 %   Abs Immature Granulocytes 0.02 0.00 - 0.07 K/uL   Basic metabolic panel     Status: Abnormal   Collection Time: 05/25/20  3:22 PM  Result Value Ref Range   Sodium 133 (L) 135 - 145 mmol/L   Potassium 3.3 (L) 3.5 - 5.1 mmol/L   Chloride 103 98 - 111 mmol/L   CO2 23 22 - 32 mmol/L   Glucose, Bld 101 (H) 70 - 99 mg/dL   BUN 14 6 - 20 mg/dL   Creatinine, Ser 0.57 0.44 - 1.00 mg/dL   Calcium 8.3 (L) 8.9 - 10.3 mg/dL   GFR, Estimated >60 >60 mL/min   Anion gap 7 5 - 15    MDM Start IV LR Bolus Antiemetic Antidiarrhea  Labs: UC, CBC/D, CMP Assessment and Plan  30 year old [id number] SIUP at 22.2 weeks Gastroenteritis   -Patient informed that her food choices could be possible reason for her symptoms. -POC Reviewed. -Exam performed. -Labs ordered. -Start IV and give LR bolus -Will give zofran for nausea and imodium for diarrhea. -Will await UA results and give additional fluids/meds as needed.  Maryann Conners 05/25/2020, 2:58 PM   Reassessment (4:37 PM)  -Nurse reports patient improved and tolerating crackers. -Rx for imodium and zofran for home usage sent to pharmacy on file.  -Provider to bedside and patient agreeable to Covid testing prior to discharge. -Informed that results will be sent/released via mychart. -Patient is vaccinated x 2 and s/p booster. -Reiterated need to return if symptoms continue despite treatment and/or after 48 hours.  -Encouraged to call or return to MAU if symptoms worsen or with the onset of new symptoms. -Discharged to home in stable condition.  Maryann Conners MSN, CNM Advanced Practice Provider, [facility]

## 2020-05-26 LAB — CULTURE, OB URINE: Culture: NO GROWTH

## 2020-08-27 LAB — OB RESULTS CONSOLE GBS: GBS: NEGATIVE

## 2020-09-16 ENCOUNTER — Telehealth (HOSPITAL_COMMUNITY): Payer: Self-pay | Admitting: *Deleted

## 2020-09-16 NOTE — Telephone Encounter (Signed)
Preadmission screen  

## 2020-09-17 ENCOUNTER — Telehealth (HOSPITAL_COMMUNITY): Payer: Self-pay | Admitting: *Deleted

## 2020-09-17 ENCOUNTER — Encounter (HOSPITAL_COMMUNITY): Payer: Self-pay | Admitting: *Deleted

## 2020-09-17 NOTE — Telephone Encounter (Signed)
Preadmission screen  

## 2020-09-21 ENCOUNTER — Other Ambulatory Visit (HOSPITAL_COMMUNITY): Payer: 59

## 2020-09-21 NOTE — H&P (Addendum)
Alison Harrison is a 30 y.o. female presenting for eIOL. Pregnancy complicated. They are expecting their second boy [place]). History of prior SVD of a 7#11 female (FORD). She tested positive for COVID three weeks ago (documentation visualized).   OB History     Gravida  4   Para  1   Term  1   Preterm      AB  2   Living  1      SAB  2   IAB      Ectopic      Multiple  0   Live Births  1          Past Medical History:  Diagnosis Date   Allergy    Asthma    exercise induced   Chicken pox    GERD (gastroesophageal reflux disease)    Pneumonia 06/2017   Past Surgical History:  Procedure Laterality Date   WISDOM TOOTH EXTRACTION     Family History: family history includes Alcohol abuse in her maternal grandfather; Arthritis in her maternal grandmother; Cancer in her paternal grandfather and paternal grandmother; Diabetes in her maternal grandfather; Hypertension in her mother; Miscarriages / [place] in her sister. Social History:  reports that she has never smoked. She has never used smokeless tobacco. She reports previous alcohol use. She reports that she does not use drugs.     Maternal Diabetes: No Genetic Screening: Normal Maternal Ultrasounds/Referrals: Normal Fetal Ultrasounds or other Referrals:  None Maternal Substance Abuse:  No Significant Maternal Medications:  None Significant Maternal Lab Results:  Group B Strep negative Other Comments:  None  Review of Systems History   currently breastfeeding. Exam Physical Exam  (from office) NAD, A&O NWOB Abd soft, nondistended, gravid  Prenatal labs: ABO, Rh: A/Positive/-- (01/05 0000) Antibody: Negative (01/05 0000) Rubella: Immune (01/05 0000) RPR: Nonreactive (01/05 0000)  HBsAg: Negative (01/05 0000)  HIV: Non-reactive (01/05 0000)  GBS:   Negative on 08/27/2020  Assessment/Plan: 30 yo G3P1011 @ 39wga presenting for IOL s/s elective. Cervix unfavorable. Plan for cytotec followed by  pitocin/AROM when more favorable.  GBS negative.      Colin Benton Alison Harrison 09/21/2020, 2:09 PM

## 2020-09-23 ENCOUNTER — Inpatient Hospital Stay (HOSPITAL_COMMUNITY)
Admission: AD | Admit: 2020-09-23 | Discharge: 2020-09-26 | DRG: 788 | Disposition: A | Discharge status: Home / Self Care | Payer: 59 | Attending: Obstetrics and Gynecology | Admitting: Obstetrics and Gynecology

## 2020-09-23 ENCOUNTER — Inpatient Hospital Stay (HOSPITAL_COMMUNITY): Payer: 59

## 2020-09-23 ENCOUNTER — Encounter (HOSPITAL_COMMUNITY): Payer: Self-pay | Admitting: Obstetrics and Gynecology

## 2020-09-23 ENCOUNTER — Encounter (HOSPITAL_COMMUNITY)
Admission: AD | Disposition: A | Discharge status: Home / Self Care | Payer: Self-pay | Source: Home / Self Care | Attending: Obstetrics and Gynecology

## 2020-09-23 ENCOUNTER — Other Ambulatory Visit: Payer: Self-pay

## 2020-09-23 ENCOUNTER — Inpatient Hospital Stay (HOSPITAL_COMMUNITY): Payer: 59 | Admitting: Anesthesiology

## 2020-09-23 DIAGNOSIS — I959 Hypotension, unspecified: Secondary | ICD-10-CM | POA: Diagnosis not present

## 2020-09-23 DIAGNOSIS — D500 Iron deficiency anemia secondary to blood loss (chronic): Secondary | ICD-10-CM | POA: Diagnosis not present

## 2020-09-23 DIAGNOSIS — Z3A39 39 weeks gestation of pregnancy: Secondary | ICD-10-CM

## 2020-09-23 DIAGNOSIS — R0902 Hypoxemia: Secondary | ICD-10-CM | POA: Diagnosis not present

## 2020-09-23 DIAGNOSIS — R0789 Other chest pain: Secondary | ICD-10-CM | POA: Diagnosis not present

## 2020-09-23 DIAGNOSIS — O99892 Other specified diseases and conditions complicating childbirth: Secondary | ICD-10-CM | POA: Diagnosis not present

## 2020-09-23 DIAGNOSIS — O9081 Anemia of the puerperium: Secondary | ICD-10-CM | POA: Diagnosis not present

## 2020-09-23 DIAGNOSIS — Z8616 Personal history of COVID-19: Secondary | ICD-10-CM | POA: Diagnosis not present

## 2020-09-23 DIAGNOSIS — Z3490 Encounter for supervision of normal pregnancy, unspecified, unspecified trimester: Secondary | ICD-10-CM

## 2020-09-23 DIAGNOSIS — R001 Bradycardia, unspecified: Secondary | ICD-10-CM

## 2020-09-23 DIAGNOSIS — F419 Anxiety disorder, unspecified: Secondary | ICD-10-CM | POA: Diagnosis not present

## 2020-09-23 DIAGNOSIS — O88119 Amniotic fluid embolism in pregnancy, unspecified trimester: Secondary | ICD-10-CM | POA: Diagnosis not present

## 2020-09-23 DIAGNOSIS — O99344 Other mental disorders complicating childbirth: Secondary | ICD-10-CM | POA: Diagnosis not present

## 2020-09-23 DIAGNOSIS — O26893 Other specified pregnancy related conditions, third trimester: Secondary | ICD-10-CM | POA: Diagnosis present

## 2020-09-23 DIAGNOSIS — O88113 Amniotic fluid embolism in pregnancy, third trimester: Secondary | ICD-10-CM | POA: Diagnosis not present

## 2020-09-23 LAB — TYPE AND SCREEN
ABO/RH(D): A POS
Antibody Screen: NEGATIVE

## 2020-09-23 LAB — CBC
HCT: 34.5 % — ABNORMAL LOW (ref 36.0–46.0)
Hemoglobin: 12 g/dL (ref 12.0–15.0)
MCH: 31.8 pg (ref 26.0–34.0)
MCHC: 34.8 g/dL (ref 30.0–36.0)
MCV: 91.5 fL (ref 80.0–100.0)
Platelets: 148 10*3/uL — ABNORMAL LOW (ref 150–400)
RBC: 3.77 MIL/uL — ABNORMAL LOW (ref 3.87–5.11)
RDW: 11.9 % (ref 11.5–15.5)
WBC: 6.9 10*3/uL (ref 4.0–10.5)
nRBC: 0 % (ref 0.0–0.2)

## 2020-09-23 SURGERY — Surgical Case
Anesthesia: Epidural

## 2020-09-23 MED ORDER — LIDOCAINE HCL (PF) 1 % IJ SOLN
INTRAMUSCULAR | Status: DC | PRN
  Administered 2020-09-23: 4 mL via EPIDURAL
  Administered 2020-09-23: 5 mL via EPIDURAL

## 2020-09-23 MED ORDER — CEFAZOLIN SODIUM-DEXTROSE 2-3 GM-%(50ML) IV SOLR
INTRAVENOUS | Status: DC | PRN
  Administered 2020-09-23: 2 g via INTRAVENOUS

## 2020-09-23 MED ORDER — OXYTOCIN BOLUS FROM INFUSION: 333.0000 mL | Freq: Once | INTRAVENOUS | Status: DC

## 2020-09-23 MED ORDER — LACTATED RINGERS IV SOLN
500.0000 mL | INTRAVENOUS | Status: DC | PRN
  Administered 2020-09-23: 500 mL via INTRAVENOUS

## 2020-09-23 MED ORDER — LACTATED RINGERS IV SOLN: 500.0000 mL | Freq: Once | INTRAVENOUS | Status: DC

## 2020-09-23 MED ORDER — SOD CITRATE-CITRIC ACID 500-334 MG/5ML PO SOLN: 30.0000 mL | ORAL | Status: DC | PRN

## 2020-09-23 MED ORDER — OXYTOCIN-SODIUM CHLORIDE 30-0.9 UT/500ML-% IV SOLN: 2.5000 [IU]/h | INTRAVENOUS | Status: DC

## 2020-09-23 MED ORDER — MISOPROSTOL 25 MCG QUARTER TABLET: 25.0000 ug | ORAL_TABLET | ORAL | Status: DC | PRN

## 2020-09-23 MED ORDER — PHENYLEPHRINE HCL (PRESSORS) 10 MG/ML IV SOLN
INTRAVENOUS | Status: DC | PRN
  Administered 2020-09-23: 80 ug via INTRAVENOUS

## 2020-09-23 MED ORDER — PHENYLEPHRINE 40 MCG/ML (10ML) SYRINGE FOR IV PUSH (FOR BLOOD PRESSURE SUPPORT)
80.0000 ug | PREFILLED_SYRINGE | INTRAVENOUS | Status: DC | PRN
  Filled 2020-09-23: qty 10

## 2020-09-23 MED ORDER — LACTATED RINGERS IV SOLN: INTRAVENOUS | Status: DC

## 2020-09-23 MED ORDER — HYDROXYZINE HCL 50 MG PO TABS: 50.0000 mg | ORAL_TABLET | Freq: Four times a day (QID) | ORAL | Status: DC | PRN

## 2020-09-23 MED ORDER — FENTANYL CITRATE (PF) 250 MCG/5ML IJ SOLN
INTRAMUSCULAR | Status: AC
  Filled 2020-09-23: qty 5

## 2020-09-23 MED ORDER — LACTATED RINGERS IV SOLN: INTRAVENOUS | Status: DC | PRN

## 2020-09-23 MED ORDER — LIDOCAINE HCL (CARDIAC) PF 100 MG/5ML IV SOSY
PREFILLED_SYRINGE | INTRAVENOUS | Status: DC | PRN
  Administered 2020-09-23: 50 mg via INTRAVENOUS

## 2020-09-23 MED ORDER — OXYCODONE-ACETAMINOPHEN 5-325 MG PO TABS: 1.0000 | ORAL_TABLET | ORAL | Status: DC | PRN

## 2020-09-23 MED ORDER — OXYCODONE-ACETAMINOPHEN 5-325 MG PO TABS
2.0000 | ORAL_TABLET | ORAL | Status: DC | PRN
Start: 2020-09-23 — End: 2020-09-24

## 2020-09-23 MED ORDER — IOHEXOL 350 MG/ML SOLN
75.0000 mL | Freq: Once | INTRAVENOUS | Status: AC | PRN
  Administered 2020-09-23: 75 mL via INTRAVENOUS

## 2020-09-23 MED ORDER — DIPHENHYDRAMINE HCL 50 MG/ML IJ SOLN: 12.5000 mg | INTRAMUSCULAR | Status: DC | PRN

## 2020-09-23 MED ORDER — ZOLPIDEM TARTRATE 5 MG PO TABS: 5.0000 mg | ORAL_TABLET | Freq: Every evening | ORAL | Status: DC | PRN

## 2020-09-23 MED ORDER — OXYTOCIN-SODIUM CHLORIDE 30-0.9 UT/500ML-% IV SOLN
1.0000 m[IU]/min | INTRAVENOUS | Status: DC
  Administered 2020-09-23: 4 m[IU]/min via INTRAVENOUS
  Administered 2020-09-23: 2 m[IU]/min via INTRAVENOUS
  Filled 2020-09-23: qty 500

## 2020-09-23 MED ORDER — BUTORPHANOL TARTRATE 1 MG/ML IJ SOLN: 1.0000 mg | INTRAMUSCULAR | Status: DC | PRN

## 2020-09-23 MED ORDER — TERBUTALINE SULFATE 1 MG/ML IJ SOLN: 0.2500 mg | Freq: Once | INTRAMUSCULAR | Status: DC | PRN

## 2020-09-23 MED ORDER — SUCCINYLCHOLINE CHLORIDE 20 MG/ML IJ SOLN
INTRAMUSCULAR | Status: DC | PRN
  Administered 2020-09-23: 120 mg via INTRAVENOUS

## 2020-09-23 MED ORDER — LIDOCAINE HCL (PF) 1 % IJ SOLN: 30.0000 mL | INTRAMUSCULAR | Status: DC | PRN

## 2020-09-23 MED ORDER — EPHEDRINE 5 MG/ML INJ: 10.0000 mg | INTRAVENOUS | Status: DC | PRN

## 2020-09-23 MED ORDER — MIDAZOLAM HCL 2 MG/2ML IJ SOLN
INTRAMUSCULAR | Status: AC
  Filled 2020-09-23: qty 2

## 2020-09-23 MED ORDER — PROPOFOL 10 MG/ML IV BOLUS
INTRAVENOUS | Status: DC | PRN
  Administered 2020-09-23: 150 mg via INTRAVENOUS

## 2020-09-23 MED ORDER — MIDAZOLAM HCL 2 MG/2ML IJ SOLN
INTRAMUSCULAR | Status: DC | PRN
Start: 2020-09-23 — End: 2020-09-23
  Administered 2020-09-23: 2 mg via INTRAVENOUS

## 2020-09-23 MED ORDER — OXYTOCIN-SODIUM CHLORIDE 30-0.9 UT/500ML-% IV SOLN
INTRAVENOUS | Status: DC | PRN
  Administered 2020-09-23: 500 mL via INTRAVENOUS
  Administered 2020-09-24: 100 mL via INTRAVENOUS

## 2020-09-23 MED ORDER — FENTANYL-BUPIVACAINE-NACL 0.5-0.125-0.9 MG/250ML-% EP SOLN
12.0000 mL/h | EPIDURAL | Status: DC | PRN
  Administered 2020-09-23: 12 mL/h via EPIDURAL
  Filled 2020-09-23: qty 250

## 2020-09-23 MED ORDER — PHENYLEPHRINE 40 MCG/ML (10ML) SYRINGE FOR IV PUSH (FOR BLOOD PRESSURE SUPPORT)
80.0000 ug | PREFILLED_SYRINGE | INTRAVENOUS | Status: DC | PRN
  Administered 2020-09-23 (×2): 80 ug via INTRAVENOUS
  Filled 2020-09-23: qty 10

## 2020-09-23 MED ORDER — DEXAMETHASONE SODIUM PHOSPHATE 4 MG/ML IJ SOLN
INTRAMUSCULAR | Status: DC | PRN
  Administered 2020-09-23: 4 mg via INTRAVENOUS

## 2020-09-23 MED ORDER — ONDANSETRON HCL 4 MG/2ML IJ SOLN
4.0000 mg | Freq: Four times a day (QID) | INTRAMUSCULAR | Status: DC | PRN
  Administered 2020-09-23: 4 mg via INTRAVENOUS
  Filled 2020-09-23: qty 2

## 2020-09-23 MED ORDER — PHENYLEPHRINE HCL-NACL 20-0.9 MG/250ML-% IV SOLN
INTRAVENOUS | Status: DC | PRN
  Administered 2020-09-23: 60 ug/min via INTRAVENOUS

## 2020-09-23 MED ORDER — ACETAMINOPHEN 325 MG PO TABS
650.0000 mg | ORAL_TABLET | ORAL | Status: DC | PRN
Start: 2020-09-23 — End: 2020-09-24

## 2020-09-23 MED ORDER — EPHEDRINE SULFATE 50 MG/ML IJ SOLN
INTRAMUSCULAR | Status: DC | PRN
  Administered 2020-09-23 – 2020-09-24 (×2): 5 mg via INTRAVENOUS

## 2020-09-23 SURGICAL SUPPLY — 39 items
BENZOIN TINCTURE PRP APPL 2/3 (GAUZE/BANDAGES/DRESSINGS) ×2 IMPLANT
CHLORAPREP W/TINT 26ML (MISCELLANEOUS) ×2 IMPLANT
CLAMP CORD UMBIL (MISCELLANEOUS) IMPLANT
CLOSURE STERI STRIP 1/2 X4 (GAUZE/BANDAGES/DRESSINGS) ×4 IMPLANT
CLOTH BEACON ORANGE TIMEOUT ST (SAFETY) ×2 IMPLANT
CLSR STERI-STRIP ANTIMIC 1/2X4 (GAUZE/BANDAGES/DRESSINGS) ×2 IMPLANT
DRSG OPSITE POSTOP 4X10 (GAUZE/BANDAGES/DRESSINGS) ×2 IMPLANT
ELECT REM PT RETURN 9FT ADLT (ELECTROSURGICAL) ×2
ELECTRODE REM PT RTRN 9FT ADLT (ELECTROSURGICAL) ×1 IMPLANT
EXTRACTOR VACUUM KIWI (MISCELLANEOUS) IMPLANT
GAUZE SPONGE 4X4 12PLY STRL LF (GAUZE/BANDAGES/DRESSINGS) ×4 IMPLANT
GLOVE BIO SURGEON STRL SZ 6.5 (GLOVE) ×2 IMPLANT
GLOVE BIOGEL PI IND STRL 6.5 (GLOVE) ×1 IMPLANT
GLOVE BIOGEL PI IND STRL 7.0 (GLOVE) ×2 IMPLANT
GLOVE BIOGEL PI INDICATOR 6.5 (GLOVE) ×1
GLOVE BIOGEL PI INDICATOR 7.0 (GLOVE) ×2
GOWN STRL REUS W/TWL LRG LVL3 (GOWN DISPOSABLE) ×4 IMPLANT
HEMOSTAT ARISTA ABSORB 3G PWDR (HEMOSTASIS) ×2 IMPLANT
KIT ABG SYR 3ML LUER SLIP (SYRINGE) ×2 IMPLANT
NEEDLE HYPO 25X5/8 SAFETYGLIDE (NEEDLE) ×2 IMPLANT
NS IRRIG 1000ML POUR BTL (IV SOLUTION) ×2 IMPLANT
PACK C SECTION WH (CUSTOM PROCEDURE TRAY) ×2 IMPLANT
PAD ABD 7.5X8 STRL (GAUZE/BANDAGES/DRESSINGS) ×2 IMPLANT
PAD OB MATERNITY 4.3X12.25 (PERSONAL CARE ITEMS) ×2 IMPLANT
PENCIL SMOKE EVAC W/HOLSTER (ELECTROSURGICAL) ×2 IMPLANT
SUT PLAIN 0 NONE (SUTURE) IMPLANT
SUT PLAIN 2 0 (SUTURE) ×1
SUT PLAIN ABS 2-0 CT1 27XMFL (SUTURE) ×1 IMPLANT
SUT VIC AB 0 CT1 36 (SUTURE) ×2 IMPLANT
SUT VIC AB 0 CTX 36 (SUTURE) ×2
SUT VIC AB 0 CTX36XBRD ANBCTRL (SUTURE) ×2 IMPLANT
SUT VIC AB 2-0 CT1 27 (SUTURE) ×1
SUT VIC AB 2-0 CT1 TAPERPNT 27 (SUTURE) ×1 IMPLANT
SUT VIC AB 3-0 CT1 27 (SUTURE) ×1
SUT VIC AB 3-0 CT1 TAPERPNT 27 (SUTURE) ×1 IMPLANT
SUT VIC AB 4-0 PS2 27 (SUTURE) ×2 IMPLANT
TOWEL OR 17X24 6PK STRL BLUE (TOWEL DISPOSABLE) ×2 IMPLANT
TRAY FOLEY W/BAG SLVR 14FR LF (SET/KITS/TRAYS/PACK) IMPLANT
WATER STERILE IRR 1000ML POUR (IV SOLUTION) ×2 IMPLANT

## 2020-09-23 NOTE — Progress Notes (Addendum)
No updates to H&P.  Remains without any s/s of COVID after testing positive > 3 weeks ago.  SVE 3/50/-2, vertex Start pitocin.

## 2020-09-23 NOTE — Anesthesia Procedure Notes (Deleted)
Date/Time: 09/24/2020 11:38 PM Performed by: [name], MD

## 2020-09-23 NOTE — Progress Notes (Signed)
Doing well after Arom was done two hours ago.  Comf with CLe.  Sve 4/80/0. Continue Pitocin.

## 2020-09-23 NOTE — Anesthesia Preprocedure Evaluation (Addendum)
Anesthesia Evaluation  Patient identified by MRN, date of birth, ID band Patient awake    Reviewed: Allergy & Precautions, NPO status , Patient's Chart, lab work & pertinent test results  History of Anesthesia Complications Negative for: history of anesthetic complications  Airway Mallampati: III   Neck ROM: Full  Mouth opening: Limited Mouth Opening Comment:  Jaw pain x 1 yr which limits mouth opening  Dental  (+) Chipped,    Pulmonary neg pulmonary ROS,    Pulmonary exam normal        Cardiovascular negative cardio ROS Normal cardiovascular exam     Neuro/Psych negative neurological ROS  negative psych ROS   GI/Hepatic Neg liver ROS, GERD  Controlled,  Endo/Other  negative endocrine ROS  Renal/GU negative Renal ROS     Musculoskeletal negative musculoskeletal ROS (+)   Abdominal   Peds  Hematology  (+) anemia ,  Plt 148k    Anesthesia Other Findings   Reproductive/Obstetrics (+) Pregnancy                           Anesthesia Physical Anesthesia Plan  ASA: 2  Anesthesia Plan: Epidural and General   Post-op Pain Management:    Induction: Rapid sequence and Intravenous  PONV Risk Score and Plan: 3 and Treatment may vary due to age or medical condition, Ondansetron, Dexamethasone and Scopolamine patch - Pre-op  Airway Management Planned: Oral ETT  Additional Equipment:   Intra-op Plan:   Post-operative Plan: Possible Post-op intubation/ventilation  Informed Consent: I have reviewed the patients History and Physical, chart, labs and discussed the procedure including the risks, benefits and alternatives for the proposed anesthesia with the patient or authorized representative who has indicated his/her understanding and acceptance.       Plan Discussed with: Anesthesiologist, Surgeon and CRNA  Anesthesia Plan Comments: (Labs reviewed. Platelets acceptable, patient not  taking any blood thinning medications. Per RN, FHR tracing reported to be stable enough for sitting procedure. Risks and benefits discussed with patient, including PDPH, backache, epidural hematoma, failed epidural, blood pressure changes, allergic reaction, and nerve injury. Patient expressed understanding and wished to proceed.  Several hours after epidural placed, patient with hemodynamic instability, concern for possible PE/AFE. Sent to stat CT scan by [facility] provider with plan for immediate c-section upon return from CT. Discussed with patient that we would proceed with general anesthesia given her hemodynamic concerns, as opposed to utilizing her epidural which has been functioning well since placement.)       Anesthesia Quick Evaluation

## 2020-09-23 NOTE — Anesthesia Procedure Notes (Signed)
Epidural Patient location during procedure: OB Start time: 09/23/2020 7:01 PM End time: 09/23/2020 7:04 PM  Staffing Anesthesiologist: [name], MD Performed: anesthesiologist   Preanesthetic Checklist Completed: patient identified, IV checked, risks and benefits discussed, monitors and equipment checked, pre-op evaluation and timeout performed  Epidural Patient position: sitting Prep: DuraPrep Patient monitoring: continuous pulse ox and blood pressure Approach: midline Location: L2-L3 Injection technique: LOR saline  Needle:  Needle type: Tuohy  Needle gauge: 17 G Needle length: 9 cm Needle insertion depth: 4.5 cm Catheter size: 19 Gauge Catheter at skin depth: 9 cm Test dose: negative and Other (1% lidocaine)  Assessment Events: blood not aspirated  Additional Notes Patient identified. Risks including, but not limited to, bleeding, infection, nerve damage, paralysis, inadequate analgesia, blood pressure changes, nausea, vomiting, allergic reaction, postpartum back pain, itching, and headache were discussed. Patient expressed understanding and wished to proceed. Sterile prep and drape, including hand hygiene, mask, and sterile gloves were used. The patient was positioned and the spine was prepped. The skin was anesthetized with lidocaine. No paraesthesia or other complication noted. The patient did not experience any signs of intravascular injection such as tinnitus or metallic taste in mouth, nor signs of intrathecal spread such as rapid motor block. Please see nursing notes for vital signs. The patient tolerated the procedure well.   Renold Don, MDReason for block:procedure for pain

## 2020-09-24 ENCOUNTER — Encounter (HOSPITAL_COMMUNITY): Payer: Self-pay | Admitting: Obstetrics and Gynecology

## 2020-09-24 ENCOUNTER — Inpatient Hospital Stay: Admission: AD | Admit: 2020-09-24 | Payer: 59 | Admitting: Pulmonary Disease

## 2020-09-24 DIAGNOSIS — R001 Bradycardia, unspecified: Secondary | ICD-10-CM | POA: Diagnosis not present

## 2020-09-24 DIAGNOSIS — O88119 Amniotic fluid embolism in pregnancy, unspecified trimester: Secondary | ICD-10-CM | POA: Diagnosis not present

## 2020-09-24 DIAGNOSIS — O88113 Amniotic fluid embolism in pregnancy, third trimester: Secondary | ICD-10-CM | POA: Diagnosis not present

## 2020-09-24 DIAGNOSIS — I959 Hypotension, unspecified: Secondary | ICD-10-CM

## 2020-09-24 LAB — GLUCOSE, CAPILLARY: Glucose-Capillary: 96 mg/dL (ref 70–99)

## 2020-09-24 LAB — CBC
HCT: 27 % — ABNORMAL LOW (ref 36.0–46.0)
HCT: 31.9 % — ABNORMAL LOW (ref 36.0–46.0)
Hemoglobin: 9.3 g/dL — ABNORMAL LOW (ref 12.0–15.0)
Hemoglobin: 11.2 g/dL — ABNORMAL LOW (ref 12.0–15.0)
MCH: 31.7 pg (ref 26.0–34.0)
MCH: 32.1 pg (ref 26.0–34.0)
MCHC: 34.4 g/dL (ref 30.0–36.0)
MCHC: 35.1 g/dL (ref 30.0–36.0)
MCV: 92.2 fL (ref 80.0–100.0)
MCV: 91.4 fL (ref 80.0–100.0)
Platelets: 101 10*3/uL — ABNORMAL LOW (ref 150–400)
Platelets: 132 10*3/uL — ABNORMAL LOW (ref 150–400)
RBC: 2.93 MIL/uL — ABNORMAL LOW (ref 3.87–5.11)
RBC: 3.49 MIL/uL — ABNORMAL LOW (ref 3.87–5.11)
RDW: 11.9 % (ref 11.5–15.5)
RDW: 11.9 % (ref 11.5–15.5)
WBC: 10.3 10*3/uL (ref 4.0–10.5)
WBC: 12.5 10*3/uL — ABNORMAL HIGH (ref 4.0–10.5)
nRBC: 0 % (ref 0.0–0.2)
nRBC: 0 % (ref 0.0–0.2)

## 2020-09-24 LAB — DIC (DISSEMINATED INTRAVASCULAR COAGULATION)PANEL
D-Dimer, Quant: 2.7 ug/mL-FEU — ABNORMAL HIGH (ref 0.00–0.50)
Fibrinogen: 449 mg/dL (ref 210–475)
INR: 1 (ref 0.8–1.2)
Platelets: 123 10*3/uL — ABNORMAL LOW (ref 150–400)
Prothrombin Time: 13 seconds (ref 11.4–15.2)
Smear Review: NONE SEEN
aPTT: 25 seconds (ref 24–36)

## 2020-09-24 LAB — MRSA NEXT GEN BY PCR, NASAL: MRSA by PCR Next Gen: NOT DETECTED

## 2020-09-24 LAB — RPR: RPR Ser Ql: NONREACTIVE

## 2020-09-24 LAB — FIBRINOGEN: Fibrinogen: 314 mg/dL (ref 210–475)

## 2020-09-24 LAB — PROTIME-INR
INR: 1.1 (ref 0.8–1.2)
Prothrombin Time: 14 seconds (ref 11.4–15.2)

## 2020-09-24 MED ORDER — ONDANSETRON HCL 4 MG/2ML IJ SOLN
INTRAMUSCULAR | Status: DC | PRN
  Administered 2020-09-23: 4 mg via INTRAVENOUS

## 2020-09-24 MED ORDER — IBUPROFEN 600 MG PO TABS
600.0000 mg | ORAL_TABLET | Freq: Four times a day (QID) | ORAL | Status: DC
  Administered 2020-09-25 – 2020-09-26 (×6): 600 mg via ORAL
  Filled 2020-09-24 (×6): qty 1

## 2020-09-24 MED ORDER — PHENYLEPHRINE HCL-NACL 20-0.9 MG/250ML-% IV SOLN
INTRAVENOUS | Status: AC
  Filled 2020-09-24: qty 250

## 2020-09-24 MED ORDER — OXYCODONE HCL 5 MG PO TABS: 5.0000 mg | ORAL_TABLET | ORAL | Status: DC | PRN

## 2020-09-24 MED ORDER — FENTANYL CITRATE (PF) 100 MCG/2ML IJ SOLN
INTRAMUSCULAR | Status: DC | PRN
  Administered 2020-09-23: 50 ug via INTRAVENOUS

## 2020-09-24 MED ORDER — MEPERIDINE HCL 25 MG/ML IJ SOLN
INTRAMUSCULAR | Status: AC
  Filled 2020-09-24: qty 1

## 2020-09-24 MED ORDER — OXYCODONE HCL 5 MG PO TABS: 5.0000 mg | ORAL_TABLET | Freq: Once | ORAL | Status: DC | PRN

## 2020-09-24 MED ORDER — PROMETHAZINE HCL 25 MG/ML IJ SOLN: 6.2500 mg | INTRAMUSCULAR | Status: DC | PRN

## 2020-09-24 MED ORDER — SODIUM CHLORIDE 0.9 % IV SOLN: INTRAVENOUS | Status: DC | PRN

## 2020-09-24 MED ORDER — MEPERIDINE HCL 25 MG/ML IJ SOLN: 6.2500 mg | INTRAMUSCULAR | Status: DC | PRN

## 2020-09-24 MED ORDER — LIDOCAINE 2% (20 MG/ML) 5 ML SYRINGE
INTRAMUSCULAR | Status: AC
  Filled 2020-09-24: qty 5

## 2020-09-24 MED ORDER — SUCCINYLCHOLINE CHLORIDE 200 MG/10ML IV SOSY
PREFILLED_SYRINGE | INTRAVENOUS | Status: AC
  Filled 2020-09-24: qty 10

## 2020-09-24 MED ORDER — OXYTOCIN-SODIUM CHLORIDE 30-0.9 UT/500ML-% IV SOLN
INTRAVENOUS | Status: AC
  Filled 2020-09-24: qty 500

## 2020-09-24 MED ORDER — DIPHENHYDRAMINE HCL 25 MG PO CAPS: 25.0000 mg | ORAL_CAPSULE | Freq: Four times a day (QID) | ORAL | Status: DC | PRN

## 2020-09-24 MED ORDER — NALBUPHINE HCL 10 MG/ML IJ SOLN
5.0000 mg | Freq: Once | INTRAMUSCULAR | Status: DC | PRN
  Filled 2020-09-24: qty 0.5

## 2020-09-24 MED ORDER — SCOPOLAMINE 1 MG/3DAYS TD PT72: 1.0000 | MEDICATED_PATCH | Freq: Once | TRANSDERMAL | Status: DC

## 2020-09-24 MED ORDER — ONDANSETRON HCL 4 MG/2ML IJ SOLN: 4.0000 mg | Freq: Three times a day (TID) | INTRAMUSCULAR | Status: DC | PRN

## 2020-09-24 MED ORDER — COCONUT OIL OIL: 1.0000 "application " | TOPICAL_OIL | Status: DC | PRN

## 2020-09-24 MED ORDER — TETANUS-DIPHTH-ACELL PERTUSSIS 5-2.5-18.5 LF-MCG/0.5 IM SUSY: 0.5000 mL | PREFILLED_SYRINGE | Freq: Once | INTRAMUSCULAR | Status: DC

## 2020-09-24 MED ORDER — SENNOSIDES-DOCUSATE SODIUM 8.6-50 MG PO TABS
2.0000 | ORAL_TABLET | Freq: Every day | ORAL | Status: DC
  Administered 2020-09-25 – 2020-09-26 (×2): 2 via ORAL
  Filled 2020-09-24 (×2): qty 2

## 2020-09-24 MED ORDER — ROCURONIUM BROMIDE 10 MG/ML (PF) SYRINGE
PREFILLED_SYRINGE | INTRAVENOUS | Status: AC
  Filled 2020-09-24: qty 10

## 2020-09-24 MED ORDER — NALBUPHINE HCL 10 MG/ML IJ SOLN
5.0000 mg | INTRAMUSCULAR | Status: DC | PRN
  Filled 2020-09-24: qty 0.5

## 2020-09-24 MED ORDER — KETOROLAC TROMETHAMINE 30 MG/ML IJ SOLN: 30.0000 mg | Freq: Four times a day (QID) | INTRAMUSCULAR | Status: AC | PRN

## 2020-09-24 MED ORDER — DIBUCAINE (PERIANAL) 1 % EX OINT: 1.0000 "application " | TOPICAL_OINTMENT | CUTANEOUS | Status: DC | PRN

## 2020-09-24 MED ORDER — ONDANSETRON HCL 4 MG/2ML IJ SOLN: 4.0000 mg | INTRAMUSCULAR | Status: DC | PRN

## 2020-09-24 MED ORDER — SODIUM CHLORIDE 0.9% FLUSH: 3.0000 mL | INTRAVENOUS | Status: DC | PRN

## 2020-09-24 MED ORDER — NALOXONE HCL 4 MG/10ML IJ SOLN
1.0000 ug/kg/h | INTRAVENOUS | Status: DC | PRN
  Filled 2020-09-24: qty 5

## 2020-09-24 MED ORDER — ONDANSETRON HCL 4 MG/2ML IJ SOLN
INTRAMUSCULAR | Status: AC
  Filled 2020-09-24: qty 2

## 2020-09-24 MED ORDER — MORPHINE SULFATE (PF) 0.5 MG/ML IJ SOLN
INTRAMUSCULAR | Status: AC
  Filled 2020-09-24: qty 10

## 2020-09-24 MED ORDER — STERILE WATER FOR IRRIGATION IR SOLN
Status: DC | PRN
  Administered 2020-09-24: 1

## 2020-09-24 MED ORDER — KETOROLAC TROMETHAMINE 30 MG/ML IJ SOLN
INTRAMUSCULAR | Status: AC
  Filled 2020-09-24: qty 1

## 2020-09-24 MED ORDER — FENTANYL CITRATE (PF) 100 MCG/2ML IJ SOLN: 25.0000 ug | INTRAMUSCULAR | Status: DC | PRN

## 2020-09-24 MED ORDER — WITCH HAZEL-GLYCERIN EX PADS: 1.0000 "application " | MEDICATED_PAD | CUTANEOUS | Status: DC | PRN

## 2020-09-24 MED ORDER — MORPHINE SULFATE (PF) 10 MG/ML IV SOLN
INTRAVENOUS | Status: DC | PRN
  Administered 2020-09-24: 3 mg via BUCCAL

## 2020-09-24 MED ORDER — DIPHENHYDRAMINE HCL 50 MG/ML IJ SOLN: 12.5000 mg | INTRAMUSCULAR | Status: DC | PRN

## 2020-09-24 MED ORDER — CHLORHEXIDINE GLUCONATE CLOTH 2 % EX PADS
6.0000 | MEDICATED_PAD | Freq: Every day | CUTANEOUS | Status: DC
  Administered 2020-09-24: 6 via TOPICAL

## 2020-09-24 MED ORDER — EPHEDRINE 5 MG/ML INJ
INTRAVENOUS | Status: AC
  Filled 2020-09-24: qty 5

## 2020-09-24 MED ORDER — DIPHENHYDRAMINE HCL 25 MG PO CAPS: 25.0000 mg | ORAL_CAPSULE | ORAL | Status: DC | PRN

## 2020-09-24 MED ORDER — LACTATED RINGERS IV SOLN: INTRAVENOUS | Status: DC

## 2020-09-24 MED ORDER — KETOROLAC TROMETHAMINE 30 MG/ML IJ SOLN
30.0000 mg | Freq: Four times a day (QID) | INTRAMUSCULAR | Status: AC | PRN
  Administered 2020-09-24: 30 mg via INTRAVENOUS

## 2020-09-24 MED ORDER — ZOLPIDEM TARTRATE 5 MG PO TABS: 5.0000 mg | ORAL_TABLET | Freq: Every evening | ORAL | Status: DC | PRN

## 2020-09-24 MED ORDER — KETOROLAC TROMETHAMINE 30 MG/ML IJ SOLN
30.0000 mg | Freq: Four times a day (QID) | INTRAMUSCULAR | Status: AC
  Administered 2020-09-24 – 2020-09-25 (×3): 30 mg via INTRAVENOUS
  Filled 2020-09-24 (×3): qty 1

## 2020-09-24 MED ORDER — ACETAMINOPHEN 500 MG PO TABS
1000.0000 mg | ORAL_TABLET | Freq: Four times a day (QID) | ORAL | Status: DC
  Administered 2020-09-24 – 2020-09-26 (×8): 1000 mg via ORAL
  Filled 2020-09-24 (×9): qty 2

## 2020-09-24 MED ORDER — SIMETHICONE 80 MG PO CHEW
80.0000 mg | CHEWABLE_TABLET | Freq: Three times a day (TID) | ORAL | Status: DC
  Administered 2020-09-24 – 2020-09-26 (×6): 80 mg via ORAL
  Filled 2020-09-24 (×7): qty 1

## 2020-09-24 MED ORDER — NALBUPHINE HCL 10 MG/ML IJ SOLN
5.0000 mg | Freq: Once | INTRAMUSCULAR | Status: DC | PRN
Start: 2020-09-24 — End: 2020-09-26
  Filled 2020-09-24: qty 0.5

## 2020-09-24 MED ORDER — OXYCODONE HCL 5 MG/5ML PO SOLN: 5.0000 mg | Freq: Once | ORAL | Status: DC | PRN

## 2020-09-24 MED ORDER — OXYTOCIN-SODIUM CHLORIDE 30-0.9 UT/500ML-% IV SOLN
2.5000 [IU]/h | INTRAVENOUS | Status: DC
  Administered 2020-09-24 (×2): 2.5 [IU]/h via INTRAVENOUS
  Filled 2020-09-24: qty 500

## 2020-09-24 MED ORDER — PHENYLEPHRINE 40 MCG/ML (10ML) SYRINGE FOR IV PUSH (FOR BLOOD PRESSURE SUPPORT)
PREFILLED_SYRINGE | INTRAVENOUS | Status: AC
  Filled 2020-09-24: qty 10

## 2020-09-24 MED ORDER — SIMETHICONE 80 MG PO CHEW
80.0000 mg | CHEWABLE_TABLET | ORAL | Status: DC | PRN
  Filled 2020-09-24: qty 1

## 2020-09-24 MED ORDER — NALOXONE HCL 0.4 MG/ML IJ SOLN: 0.4000 mg | INTRAMUSCULAR | Status: DC | PRN

## 2020-09-24 MED ORDER — PRENATAL MULTIVITAMIN CH
1.0000 | ORAL_TABLET | Freq: Every day | ORAL | Status: DC
  Administered 2020-09-25: 1 via ORAL
  Filled 2020-09-24 (×3): qty 1

## 2020-09-24 MED ORDER — SCOPOLAMINE 1 MG/3DAYS TD PT72
MEDICATED_PATCH | TRANSDERMAL | Status: DC | PRN
  Administered 2020-09-24: 1 via TRANSDERMAL

## 2020-09-24 MED ORDER — HYDROMORPHONE HCL 1 MG/ML IJ SOLN: 0.2000 mg | INTRAMUSCULAR | Status: DC | PRN

## 2020-09-24 MED ORDER — NALBUPHINE HCL 10 MG/ML IJ SOLN
5.0000 mg | INTRAMUSCULAR | Status: DC | PRN
Start: 2020-09-24 — End: 2020-09-26
  Filled 2020-09-24: qty 0.5

## 2020-09-24 MED ORDER — MENTHOL 3 MG MT LOZG: 1.0000 | LOZENGE | OROMUCOSAL | Status: DC | PRN

## 2020-09-24 NOTE — Lactation Note (Addendum)
This note was copied from a baby's chart. Lactation Consultation Note  Patient Name: Alison Harrison [id number] Date: 09/24/2020 Reason for consult: Follow-up assessment;Mother's request;Term;Other (Comment) (amniotic embolism for mother/contrast radiology Iohexol L2 [place]) Age:31 hours Mom offering formula prior to [facility] arrival and infant resting comfortably. Mom set up with dEBP pumped x1 with 24 flange stated comfortable fit.   [place] reviewed with mother feeding cues, hand expression, spoon feeding and breastfeeding supplementation volume.  With hand expression, mother drops of colostrum and use it for nipple care as well.   Mom to call for latch assistance when infant cues.   Plan. 1. To feed based on cues 8-12x in 24 hr period no more than 4 hrs without a feeding. Mom to offer both breasts and look for signs of milk transfer.  2. Dad to supplement with EBM first followed by formula with yellow slow flow nipple and pace bottle feeding.  3. DEPB q 3 hrs for 15 min  4 I and O sheet reviewed.  5. [place] brochure of inpatient and outpatient services reviewed.  All questions answered at the end of the visit.  Maternal Data Has patient been taught Hand Expression?: Yes Does the patient have breastfeeding experience prior to this delivery?: Yes How long did the patient breastfeed?: 10- months with use of NS, complimentary foods mother decline milk supply and supplemented  Feeding Mother's Current Feeding Choice: Breast Milk and Formula Nipple Type: Slow - flow  LATCH Score                    Lactation Tools Discussed/Used Tools: Pump;Flanges Flange Size: 24 Breast pump type: Double-Electric Breast Pump (LC assessed flange size last night. Mom stated comfortable fit) Pump Education: Setup, frequency, and cleaning;Milk Storage Reason for Pumping: increase stimulation Pumping frequency: every 3 hrs for 15 min  Interventions Interventions: Breast feeding basics  reviewed;Education;Position options;Skin to skin;Expressed milk;Breast massage;Hand express;Breast compression;DEBP  Discharge Pump: Personal  Consult Status Consult Status: Follow-up Date: 09/25/20 Follow-up type: In-patient    Alison Guzzetta  Harrison 09/24/2020, 1:12 PM

## 2020-09-24 NOTE — Progress Notes (Signed)
Patient had been doing great with a reassuring FHT and normal vitals signs. She had a CLE placed a few hours prior to this. Called to bedside for variable decelerations with every contraction x 10 minutes.  During the phone call, patient's BP dropped to 80s/40s and patient became acutely hypoxic. Prior to that her Bps were normotensive - 120s/70s.HR in the 70s. O2 100%. I was at the bedside within 2 minutes of this phone call. I ordered a stat ekg and CXR. EKG could not be done s/s patient shaking. Bps continued to remain in the 80s/40s despite multiple doses of phenylephrine. She continued to have O2 saturations in the 80s to low 90s. She then became bradycardic to the 30s. She was placed on 3L nonbreather O2 and oxygen slightly improved to the low 90s. Anesthesia also evaluated the patient and felt there was a very low suspicion for her CLE causing these issues. I agreed given timing and symptoms.  Given HR dipping in the 30s, persistent hypotension requiring pressors,  and oxygen desaturation in the 80s, I had high suspicion for  PE or AFE. Recent COVID-19 infection and term pregnancy put her at high risk for a pulmonary embolism.  I made the decision to take her straight to a stat Ct to evaluate for a PE. This was done in less than ten minutes. CT showed no PE. CXR wasn't done since CT was done. Her Bps and HR continued to dip. Decision was made to take her straight for a primary cesarean section to relieve any pressure from her gravid uterus and to move forward with intubation given persistent low oxygen as I had a very high concern to AFE.  Please see operative note.    Arty Baumgartner MD

## 2020-09-24 NOTE — Transfer of Care (Signed)
Immediate Anesthesia Transfer of Care Note  Patient: Alison Harrison  Procedure(s) Performed: CESAREAN SECTION  Patient Location: PACU  Anesthesia Type:General  Level of Consciousness: awake, alert  and oriented  Airway & Oxygen Therapy: Patient Spontanous Breathing and Patient connected to nasal cannula oxygen  Post-op Assessment: Report given to RN and Post -op Vital signs reviewed and stable  Post vital signs: Reviewed and stable  Last Vitals:  Vitals Value Taken Time  BP 125/89 09/24/20 0047  Temp 36.8 C 09/24/20 0040  Pulse 100 09/24/20 0058  Resp 14 09/24/20 0058  SpO2 98 % 09/24/20 0058  Vitals shown include unvalidated device data.  Last Pain:  Vitals:   09/24/20 0040  TempSrc:   PainSc: 0-No pain         Complications: No notable events documented.

## 2020-09-24 NOTE — Progress Notes (Signed)
RN responded to bedside due to fetal variable decelerations shown on monitors. Nursing interventions performed w/ improvement to FHR tracing. MD notified of FHR change and interventions. During phone call MD also notified regarding pt suddenly feeling dizzy, lightheaded, and nauseous. On assessment pt appeared pale and did not look well. Pt was hypotensive at this time, and oxygen began to decrease shortly thereafter. IV fluid bolus infusing, PRN medication given to improve BP, & 15L NRB given. MD to bedside as well as anesthesia. OBRRN present. Pt remained monitored and VS with not much improvement. Pitocin stopped per MD. Rapid response nurse contacted and at bedside prior to CT transfer. Pt transferred to STAT CT scan accompanied by staff and AC. On arrival to radiology FHR obtained by doppler. Immediately after CT scan pt transferred to OR for cesarean section under general anesthesia. Pt monitored by staff throughout. Pt remained stable during post op recovery, bleeding minimal, fundus firm, VSS. O2 therapy discontinued per anesthesia w/ stable 02 readings. Pt transferred to ICU per MD once recovery care was complete.   Pilar Jarvis, RN

## 2020-09-24 NOTE — Anesthesia Procedure Notes (Deleted)
Procedure Name: Intubation Date/Time: 09/24/2020 11:49 PM Performed by: Sandrea Matte, CRNA Pre-anesthesia Checklist: Patient identified, Emergency Drugs available, Suction available and Patient being monitored Patient Re-evaluated:Patient Re-evaluated prior to induction Oxygen Delivery Method: Circle system utilized Preoxygenation: Pre-oxygenation with 100% oxygen Induction Type: IV induction Laryngoscope Size: Glidescope and 3 Tube type: Oral Tube size: 7.0 mm Number of attempts: 1 Airway Equipment and Method: Stylet Placement Confirmation: ETT inserted through vocal cords under direct vision, positive ETCO2 and breath sounds checked- equal and bilateral Secured at: 21 cm Dental Injury: Teeth and Oropharynx as per pre-operative assessment

## 2020-09-24 NOTE — Progress Notes (Addendum)
Patient awake and extubated.  Vital sign snow very stable and being weaned off oxygen.  Bleeding and abdominal exam is appropriate.  Hgb down to 9.6 from 13 but no s/s anemia currently. Fibrinogen and INR WNL. Events of delivery d/w pt and her husband and they understand.  Will admit to ICU and continue to follow closely.

## 2020-09-24 NOTE — Anesthesia Postprocedure Evaluation (Signed)
Anesthesia Post Note  Patient: Alison Harrison  Procedure(s) Performed: [place]     Patient location during evaluation: PACU Anesthesia Type: Epidural and General Level of consciousness: awake and alert Pain management: pain level controlled Vital Signs Assessment: post-procedure vital signs reviewed and stable Respiratory status: spontaneous breathing, nonlabored ventilation, respiratory function stable and patient connected to nasal cannula oxygen Cardiovascular status: blood pressure returned to baseline and stable Postop Assessment: no apparent nausea or vomiting, epidural receding and no headache Anesthetic complications: no   No notable events documented.  Last Vitals:  Vitals:   09/24/20 0430 09/24/20 0445  BP: 125/73 116/87  Pulse: 71 67  Resp: 20 16  Temp:    SpO2: 96% 97%    Last Pain:  Vitals:   09/24/20 0315  TempSrc: Axillary  PainSc:    Pain Goal:                   [name]

## 2020-09-24 NOTE — Anesthesia Procedure Notes (Addendum)
Procedure Name: Intubation Date/Time: 09/23/2020 11:49 PM Performed by: Sandrea Matte, CRNA Pre-anesthesia Checklist: Patient identified, Emergency Drugs available, Suction available and Patient being monitored Patient Re-evaluated:Patient Re-evaluated prior to induction Oxygen Delivery Method: Circle system utilized Preoxygenation: Pre-oxygenation with 100% oxygen Induction Type: IV induction Laryngoscope Size: Glidescope and 3 Tube type: Oral Tube size: 7.0 mm Number of attempts: 1 Airway Equipment and Method: Stylet Placement Confirmation: ETT inserted through vocal cords under direct vision, positive ETCO2 and breath sounds checked- equal and bilateral Secured at: 21 cm Dental Injury: Teeth and Oropharynx as per pre-operative assessment  Comments: Placed by Dr. Fransisco Beau

## 2020-09-24 NOTE — Lactation Note (Signed)
This note was copied from a baby's chart. Lactation Consultation Note  Patient Name: Alison Harrison [id number] Date: 09/24/2020 Reason for consult: Initial assessment;Mother's request;Term Age:31 hours   Initial Lactation Consult:  Visited with mother in Medical ICU after emergent cesarean section.  Mother had requested to begin pumping with the DEBP.  Upon arrival, RN in room and both parents awake.  Mother conversing about her emergent cesarean section and the uncertainty of her situation when rapid response was called.  Allowed mother time to verbalize and provided emotional support.  Praised mother for her desire to begin pumping early.  This is a P2 mother whose baby is now 11 hours old.  This is a term baby at 39+4 weeks.  Mother breast fed her first child (now 79 months) for 10 months.  Reviewed pump parts, set up and cleaning.  Mother did a return demonstration of parts assembly.  Observed her using the #24 flanges with comfort.  After approximately 10 minutes of pumping, I observed colostrum beginning to flow.  Discussed milk storage and father will bring any EBM obtained to the nursery after mother finishes pumping.  More education to follow once family returns to the M/B unit.  Mother has a DEBP for home use.  Checked on baby when I returned to the unit and received permission from nursery RN to allow father to visit.  Called Alexis, Mother's RN, and she will advise father that he may now visit.      Maternal Data Has patient been taught Hand Expression?: Yes Does the patient have breastfeeding experience prior to this delivery?: Yes How long did the patient breastfeed?: 10 months  Feeding Mother's Current Feeding Choice: Breast Milk Nipple Type: Slow - flow  LATCH Score                    Lactation Tools Discussed/Used Tools: Pump;Flanges Flange Size: 24 Breast pump type: Double-Electric Breast Pump;Manual Pump Education: Setup, frequency, and cleaning;Milk  Storage Reason for Pumping: Mother in Medical ICU; separated from baby at this time Pumping frequency: Every three hours  Interventions    Discharge Pump: DEBP;Manual;Personal WIC Program: No  Consult Status Consult Status: Follow-up Date: 09/24/20 Follow-up type: In-patient    Little Ishikawa 09/24/2020, 5:33 AM

## 2020-09-24 NOTE — Progress Notes (Signed)
   NAME:  Alison Harrison MRN:  [id number] DOB:  [birth year] LOS: 1 ADMISSION DATE:  09/23/2020  CONSULTATION DATE:  09/24/2020 REFERRING MD:  Dr. Lucillie Garfinkel  REASON FOR CONSULTATION:  amniotic fluid embolism   Pulmonary/Critical Care Consultation  Brief History   N/A  History of present illness   This 30 y.o. Caucasian female smoker is seen in consultation at the request of Dr. Lucillie Garfinkel for recommendations on further evaluation and management of possible amniotic fluid embolism.  The patient presented to [facility] on 7/19 at [redacted] weeks gestation for anticipated delivery.  PCCM was called today after the patient experience hemodynamic collapse with hypotension and profound bradycardia that was treated with emergent C-section.  Postoperatively, the patient demonstrated dramatic improvement in hemodynamics.  Nonetheless, Dr. Royston Sinner requested overnight monitoring in ICU due to concerns for possible amniotic fluid embolism.  Past Medical/Surgical/Social/Family History    has a past medical history of Allergy, Chicken pox, GERD (gastroesophageal reflux disease), and Pneumonia (06/2017).   Significant Hospital Events   7/19: admitted to [facility] 7/21: hemodynamic collapse (80/40, HR 30). Emergent C-section for suspected amniotic fluid embolism. 7/22: transfer to [facility] 38M for hemodynamic monitoring  Consults:  PCCM  Procedures:  C-section (7/21)  Significant Diagnostic Tests:    Interim history/subjective:   Doing much better, on room air, hemodynamically stable  Objective   BP 108/83   Pulse 65   Temp 99 F (37.2 C) (Axillary)   Resp 16   Ht '5\' 7"'$  (1.702 m)   Wt 79.3 kg   SpO2 97%   Breastfeeding Unknown   BMI 27.39 kg/m     Filed Weights   09/23/20 1607  Weight: 79.3 kg    Intake/Output Summary (Last 24 hours) at 09/24/2020 0746 Last data filed at 09/24/2020 0700 Gross per 24 hour  Intake 2394.97 ml  Output 3979 ml  Net -1584.03 ml        Examination: Gen:       No acute distress HEENT:  EOMI, sclera anicteric Neck:     No masses; no thyromegaly Lungs:    Clear to auscultation bilaterally; normal respiratory effort CV:         Regular rate and rhythm; no murmurs Abd:      + bowel sounds; soft, non-tender; no palpable masses, no distension Ext:    No edema; adequate peripheral perfusion Skin:      Warm and dry; no rash Neuro: alert and oriented x 3 Psych: normal mood and affect   Resolved Hospital Problem list      Assessment & Plan:  Hypotension, bradycardia Pregnancy s/p emergent C-section Suspected amniotic fluid embolism though no evidence of DIC on labs  She is doing well overnight and has orders for transfer out of ICU PCCM will be available if needed.  Please call with questions.  Best practice:  Per Primary team  Critical care time: NA   Marshell Garfinkel MD [facility] See Amion for pager  If no response to pager , please call [phone] until 7pm After 7:00 pm call Elink  [phone] 09/24/2020, 7:46 AM

## 2020-09-24 NOTE — Op Note (Addendum)
PROCEDURE DATE: 09/24/20   PREOPERATIVE DIAGNOSIS: non-reassuring maternal status, suspected amniotic fluid embolism   POSTOPERATIVE DIAGNOSIS: The same   PROCEDURE:    Primary Low Transverse Cesarean Section   SURGEON:  Dr. Lucillie Garfinkel ASSISTANT: Dr. Lynnda Shields   INDICATIONS: This is a 30yo G3P1011 at 61 wga requiring cesarean section secondary to Non-reassuring maternal status. Please see separate note. In short, maternal hypotension, bradycardia and hypoxia led to concern for amniotic fluid embolism. Decision made to proceed with LTCS. The risks of cesarean section discussed with the patient included but were not limited to: bleeding which may require transfusion or reoperation; infection which may require antibiotics; injury to bowel, bladder, ureters or other surrounding organs; injury to the fetus; need for additional procedures including hysterectomy in the event of a life-threatening hemorrhage; placental abnormalities wth subsequent pregnancies, incisional problems, thromboembolic phenomenon and other postoperative/anesthesia complications. The patient agreed with the proposed plan, giving informed consent for the procedure.     FINDINGS:  Viable female infant in vertex presentation, APGARs pending,  Weight pending, Amniotic fluid clear,  Intact placenta, three vessel cord.  Grossly normal uterus .   ANESTHESIA:    Epidural ESTIMATED BLOOD LOSS: 479cc SPECIMENS: Placenta for pathology COMPLICATIONS: None immediate   PROCEDURE IN DETAIL:  The patient received intravenous antibiotics (2g Ancef) and had sequential compression devices applied to her lower extremities while in the preoperative area.  She was then taken to the operating room where she was intubated. She was then placed in a dorsal supine position with a leftward tilt, and prepped and draped in a sterile manner.  A foley catheter was placed into her bladder and attached to constant gravity.  After an adequate timeout was  performed, a Pfannenstiel skin incision was made with scalpel and carried through to the underlying layer of fascia. The fascia was incised in the midline and this incision was extended bilaterally. Kocher clamps were applied to the superior aspect of the fascial incision and the underlying rectus muscles were dissected off bluntly. A similar process was carried out on the inferior aspect of the facial incision. The rectus muscles were separated in the midline bluntly and the peritoneum was entered bluntly.  A bladder flap was created sharply and developed bluntly. A transverse hysterotomy was made with a scalpel and extended bilaterally bluntly. The bladder blade was then removed. The infant was successfully delivered, and cord was clamped and cut and infant was handed over to awaiting neonatology team. Uterine massage was then administered and the placenta delivered intact with three-vessel cord. Cord gases were taken. The uterus was cleared of clot and debris.  The hysterotomy was closed with 0 vicryl.  A second imbricating suture of 0-vicryl was used to reinforce the incision and aid in hemostasis. Slight ooze noted resolved with arista. Rectal diathesis was noted but too severe to be safely correct without risk of stitches pulling through and hematoma formation. The fascia was closed with 0-Vicryl in a running fashion with good restoration of anatomy.  The subcutaneus tissue was irrigated and was reapproximated using three interrupted plain gut stitches.  The skin was closed with 4-0 Vicryl in a subcuticular fashion.  All surgical site and was hemostatic at end of procedure without any further bleeding on exam.   It's a boy - "Alison Harrison"!!   Pt tolerated the procedure well. All sponge/lap/needle counts were correct  X 2. Pt taken to recovery room in stable condition.     Lucillie Garfinkel MD

## 2020-09-24 NOTE — Progress Notes (Addendum)
Patient doing remarkably well over the past four hours.  Vitals have been stable off of oxygen and pressors. Really would like to see her baby. FF and lochia appropriate. I again d/w patient and her husband the events that required emergent intervention likely caused by an AFE. I discussed my recommendation for cardiology follow up as an outpatient postpartum.  Will transfer to antepartum.

## 2020-09-24 NOTE — Consult Note (Signed)
NAME:  Alison Harrison MRN:  [id number] DOB:  02-Jun-1990 LOS: 1 ADMISSION DATE:  09/23/2020  CONSULTATION DATE:  09/24/2020 REFERRING MD:  Dr. Lucillie Garfinkel  REASON FOR CONSULTATION:  amniotic fluid embolism   Initial Pulmonary/Critical Care Consultation  Brief History   N/A  History of present illness   This 30 y.o. Caucasian female smoker is seen in consultation at the request of Dr. Lucillie Garfinkel for recommendations on further evaluation and management of possible amniotic fluid embolism.  The patient presented to [facility] on 7/19 at [redacted] weeks gestation for anticipated delivery.  PCCM was called today after the patient experience hemodynamic collapse with hypotension and profound bradycardia that was treated with emergent C-section.  Postoperatively, the patient demonstrated dramatic improvement in hemodynamics.  Nonetheless, Dr. Royston Sinner requested overnight monitoring in ICU due to concerns for possible amniotic fluid embolism.  REVIEW OF SYSTEMS Constitutional: No weight loss. No night sweats. No fever. No chills. No fatigue. HEENT: No headaches, dysphagia, sore throat, otalgia, nasal congestion, PND CV:  No chest pain, orthopnea, PND, swelling in lower extremities, palpitations GI:  Incision site discomfort. No abdominal pain, nausea, vomiting, diarrhea, change in bowel pattern, anorexia Resp: No DOE, rest dyspnea, cough, mucus, hemoptysis, wheezing  GU: no dysuria, change in color of urine, no urgency or frequency.  No flank pain. MS:  No joint pain or swelling. No myalgias,  No decreased range of motion.  Psych:  No change in mood or affect. No memory loss. Skin: no rash or lesions.   Past Medical/Surgical/Social/Family History   Past Medical History:  Diagnosis Date   Allergy    Chicken pox    GERD (gastroesophageal reflux disease)    Pneumonia 06/2017    Past Surgical History:  Procedure Laterality Date   WISDOM TOOTH EXTRACTION      Social History   Tobacco Use    Smoking status: Never   Smokeless tobacco: Never  Substance Use Topics   Alcohol use: Not Currently    Family History  Problem Relation Age of Onset   Hypertension Mother    Arthritis Maternal Grandmother    Alcohol abuse Maternal Grandfather    Diabetes Maternal Grandfather    Cancer Paternal Grandmother        breast    Cancer Paternal Grandfather        prostate    Miscarriages / Stillbirths Sister      [place] Hospital Events   7/19: admitted to [facility] 7/21: hemodynamic collapse (80/40, HR 30). Emergent C-section for suspected amniotic fluid embolism. 7/22: transfer to [facility] 16M for hemodynamic monitoring   Consults:  PCCM   Procedures:  C-section (7/21)   Significant Diagnostic Tests:     Micro Data:   Results for orders placed or performed during the hospital encounter of 09/23/20  OB RESULT CONSOLE Group B Strep     Status: None   Collection Time: 08/27/20 12:00 AM  Result Value Ref Range Status   GBS Negative  Final      Antimicrobials:     Interim history/subjective:  N/A   Objective   BP 117/68   Pulse 65   Temp 98.6 F (37 C)   Resp 12   Ht '5\' 7"'$  (1.702 m)   Wt 79.3 kg   SpO2 97%   Breastfeeding Unknown   BMI 27.39 kg/m     Filed Weights   09/23/20 1607  Weight: 79.3 kg    Intake/Output Summary (Last 24 hours) at 09/24/2020 0232 Last  data filed at 09/24/2020 0105 Gross per 24 hour  Intake 1000 ml  Output 2529 ml  Net -1529 ml        Examination: GENERAL:  alert, oriented to time, person and place, pleasant, well-developed. No acute distress. HEAD: normocephalic, atraumatic EYE: PERRLA, EOM intact, no scleral icterus, no pallor. NOSE: nares are patent. No polyps. No exudate. No sinus tenderness. THROAT/ORAL CAVITY: Normal dentition. No oral thrush. No exudate. Mucous membranes are moist. No tonsillar enlargement. ETT in situ. Mallampati class I (soft palate, uvula, fauces, and tonsillar pillars visible) airway. NECK:  supple, no thyromegaly, no JVD, no lymphadenopathy. Trachea midline. CHEST/LUNG: symmetric in development and expansion. Good air entry. No crackles. No wheezes. HEART: Regular S1 and S2 without murmur, rub or gallop. ABDOMEN: C-section incision bandaged (C/D/I).  Soft, nontender, nondistended. Normoactive bowel sounds. No rebound. No guarding. No hepatosplenomegaly. EXTREMITIES: Edema: Trace. No cyanosis. No clubbing. 2+ DP pulses LYMPHATIC: no cervical/axillary/inguinal lymph nodes appreciated MUSCULOSKELETAL: No point tenderness. No bulk atrophy. Joints: normal inspection.  SKIN:  No rash or lesion. NEUROLOGIC: Doll's eyes intact. Corneal reflex intact. Spontaneous respirations intact. Cranial nerves II-XII are grossly symmetric and physiologic. Babinski absent. No sensory deficit. Motor: 5/5 @ RUE, 5/5 @ LUE, 5/5 @ RLL,  5/5 @ LLL.  DTR: 2+ @ R biceps, 2+ @ L biceps, 2+ @ R patellar,  2+ @ L patellar. No cerebellar signs. Gait was not assessed.   Resolved Hospital Problem list      Assessment & Plan:   ASSESSMENT/PLAN:  ASSESSMENT (included in the Hospital Problem List)  Active Problems:   Hypotension   Bradycardia   Amniotic fluid embolism during pregnancy   Pregnancy   By systems: PULMONARY: No acute issues Wean supplemental oxygen to room air as tolerated.   CARDIOVASCULAR Hypotension Bradyarrhythmia Hemodynamic monitoring per ICU protocol   RENAL: No acute issues Monitor urine output   GASTROINTESTINAL GI PROPHYLAXIS: famotidine   HEMATOLOGIC Suspected amniotic fluid embolism DIC panel DVT PROPHYLAXIS: SCDs for now, pending DIC labs   INFECTIOUS: No acute issues GBS screen negative   ENDOCRINE High risk pregnancy s/p emergent C-section N.b. this patient wishes to breastfeed.   NEUROLOGIC: No acute issues   PLAN/RECOMMENDATIONS  Transfer to ICU for further evaluation and management of amiotic fluid embolism. IV fluids: Continue LR. Meds: see above and  orders Labs: DIC panel NUTRITION: regular    My assessment, plan of care, findings, medications, side effects, etc. were discussed with: nurse and patient (answered all questions to patient's satisfaction).   Best practice:  Diet: regular Pain/Anxiety/Delirium protocol (if indicated): N/A VAP protocol (if indicated): N/A DVT prophylaxis: SCDs for now GI prophylaxis: famotidine Glucose control: N/A Mobility/Activity: bedrest CODE STATUS:   Code Status: Prior Family Communication:  patient's family (husband) at bedside Disposition: transfer to ICU   Labs   CBC: Recent Labs  Lab 09/23/20 1624 09/24/20 0019  WBC 6.9 10.3  HGB 12.0 9.3*  HCT 34.5* 27.0*  MCV 91.5 92.2  PLT 148* 101*    Basic Metabolic Panel: No results for input(s): NA, K, CL, CO2, GLUCOSE, BUN, CREATININE, CALCIUM, MG, PHOS in the last 168 hours. GFR: CrCl cannot be calculated (Patient's most recent lab result is older than the maximum 21 days allowed.). Recent Labs  Lab 09/23/20 1624 09/24/20 0019  WBC 6.9 10.3    Liver Function Tests: No results for input(s): AST, ALT, ALKPHOS, BILITOT, PROT, ALBUMIN in the last 168 hours. No results for input(s): LIPASE, AMYLASE in the  last 168 hours. No results for input(s): AMMONIA in the last 168 hours.  ABG No results found for: PHART, PCO2ART, PO2ART, HCO3, TCO2, ACIDBASEDEF, O2SAT   Coagulation Profile: Recent Labs  Lab 09/24/20 0019  INR 1.1    Cardiac Enzymes: No results for input(s): CKTOTAL, CKMB, CKMBINDEX, TROPONINI in the last 168 hours.  HbA1C: No results found for: HGBA1C  CBG: No results for input(s): GLUCAP in the last 168 hours.   Review of Systems:   See above   Past Medical History   Past Medical History:  Diagnosis Date   Allergy    Chicken pox    GERD (gastroesophageal reflux disease)    Pneumonia 06/2017      Surgical History    Past Surgical History:  Procedure Laterality Date   WISDOM TOOTH EXTRACTION         Social History   Social History   Socioeconomic History   Marital status: Married    Spouse name: [name]   Number of children: Not on file   Years of education: Not on file   Highest education level: Not on file  Occupational History   Not on file  Tobacco Use   Smoking status: Never   Smokeless tobacco: Never  Vaping Use   Vaping Use: Never used  Substance and Sexual Activity   Alcohol use: Not Currently   Drug use: Never    Comment: husband    Sexual activity: Yes    Partners: Male    Birth control/protection: None  Other Topics Concern   Not on file  Social History Narrative   Stay at home mom   From [place]    Married 1 kid    Bachelors degree    Used to play volleyballl, Basketball, track    Owns guns, wears seat belt, safe in relationship    Social Determinants of Radio broadcast assistant Strain: Not on file  Food Insecurity: Not on file  Transportation Needs: Not on file  Physical Activity: Not on file  Stress: Not on file  Social Connections: Not on file      Family History    Family History  Problem Relation Age of Onset   Hypertension Mother    Arthritis Maternal Grandmother    Alcohol abuse Maternal Grandfather    Diabetes Maternal Grandfather    Cancer Paternal Grandmother        breast    Cancer Paternal Grandfather        prostate    Miscarriages / [place] Sister    family history includes Alcohol abuse in her maternal grandfather; Arthritis in her maternal grandmother; Cancer in her paternal grandfather and paternal grandmother; Diabetes in her maternal grandfather; Hypertension in her mother; Miscarriages / [place] in her sister.    Allergies Allergies  Allergen Reactions   Tetracycline Hives    Sob      Current Medications  Current Facility-Administered Medications:    Chlorhexidine Gluconate Cloth 2 % PADS 6 each, 6 each, Topical, Q0600, Royston Sinner, Colin Benton, MD   diphenhydrAMINE (BENADRYL) injection 12.5  mg, 12.5 mg, Intravenous, Q4H PRN **OR** diphenhydrAMINE (BENADRYL) capsule 25 mg, 25 mg, Oral, Q4H PRN, [name], MD   ketorolac (TORADOL) 30 MG/ML injection 30 mg, 30 mg, Intravenous, Q6H PRN, 30 mg at 09/24/20 0116 **OR** ketorolac (TORADOL) 30 MG/ML injection 30 mg, 30 mg, Intramuscular, Q6H PRN, [name], MD   lactated ringers infusion, , Intravenous, Continuous, Royston Sinner, Colin Benton, MD  nalbuphine (NUBAIN) injection 5 mg, 5 mg, Intravenous, Q4H PRN **OR** nalbuphine (NUBAIN) injection 5 mg, 5 mg, Subcutaneous, Q4H PRN, [name], MD   nalbuphine (NUBAIN) injection 5 mg, 5 mg, Intravenous, Once PRN **OR** nalbuphine (NUBAIN) injection 5 mg, 5 mg, Subcutaneous, Once PRN, [name], MD   naloxone [facility]) injection 0.4 mg, 0.4 mg, Intravenous, PRN **AND** sodium chloride flush (NS) 0.9 % injection 3 mL, 3 mL, Intravenous, PRN, [name], MD   naloxone HCl (NARCAN) 2 mg in dextrose 5 % 250 mL infusion, 1-4 mcg/kg/hr, Intravenous, Continuous PRN, [name], MD   ondansetron [facility]) injection 4 mg, 4 mg, Intravenous, Q8H PRN, [name], MD   scopolamine (TRANSDERM-SCOP) 1 MG/3DAYS 1.5 mg, 1 patch, Transdermal, Once, [name], MD  Home Medications  Prior to Admission medications   Medication Sig Start Date End Date Taking? Authorizing Provider  loperamide (IMODIUM) 2 MG capsule Take 1 capsule (2 mg total) by mouth 4 (four) times daily as needed for diarrhea or loose stools. 05/25/20   Gavin Pound, CNM  ondansetron (ZOFRAN ODT) 4 MG disintegrating tablet Take 1-2 tablets (4-8 mg total) by mouth every 6 (six) hours as needed for nausea or vomiting. 05/25/20   Gavin Pound, CNM  Prenatal Vit-Fe Fumarate-FA (PRENATAL MULTIVITAMIN) TABS tablet Take 1 tablet by mouth daily at 12 noon.    [provider]  sodium chloride (OCEAN) 0.65 % SOLN nasal spray Place 1 spray into both nostrils as needed for congestion. 10/03/19   [name], [name], MD     Critical care time: 30 minutes.  The treatment and management of the patient's condition was required based on the threat of imminent deterioration. This time reflects time spent by the physician evaluating, providing care and managing the critically ill patient's care. The time was spent at the immediate bedside (or on the same floor/unit and dedicated to this patient's care). Time involved in separately billable procedures is NOT included int he critical care time indicated above. Family meeting and update time may be included above if and only if the patient is unable/incompetent to participate in clinical interview and/or decision making, and the discussion was necessary to determining treatment decisions.    Renee Pain, MD Board Certified by the ABIM, [place]

## 2020-09-25 ENCOUNTER — Inpatient Hospital Stay (HOSPITAL_COMMUNITY): Payer: 59

## 2020-09-25 LAB — CBC
HCT: 28.4 % — ABNORMAL LOW (ref 36.0–46.0)
Hemoglobin: 9.6 g/dL — ABNORMAL LOW (ref 12.0–15.0)
MCH: 31.8 pg (ref 26.0–34.0)
MCHC: 33.8 g/dL (ref 30.0–36.0)
MCV: 94 fL (ref 80.0–100.0)
Platelets: 120 10*3/uL — ABNORMAL LOW (ref 150–400)
RBC: 3.02 MIL/uL — ABNORMAL LOW (ref 3.87–5.11)
RDW: 12.2 % (ref 11.5–15.5)
WBC: 7.7 10*3/uL (ref 4.0–10.5)
nRBC: 0 % (ref 0.0–0.2)

## 2020-09-25 LAB — ECHOCARDIOGRAM COMPLETE
AR max vel: 2.48 cm2
AV Area VTI: 2.48 cm2
AV Area mean vel: 2.32 cm2
AV Mean grad: 3 mmHg
AV Peak grad: 5.3 mmHg
Ao pk vel: 1.15 m/s
Area-P 1/2: 3.21 cm2
Height: 67 in
MV VTI: 2.15 cm2
S' Lateral: 2.7 cm
Weight: 2798.4 oz

## 2020-09-25 LAB — TROPONIN I (HIGH SENSITIVITY)
Troponin I (High Sensitivity): 4 ng/L (ref <18)
Troponin I (High Sensitivity): 5 ng/L (ref <18)

## 2020-09-25 MED ORDER — LORAZEPAM 0.5 MG PO TABS
1.0000 mg | ORAL_TABLET | Freq: Four times a day (QID) | ORAL | Status: DC | PRN
  Administered 2020-09-25: 1 mg via ORAL
  Filled 2020-09-25: qty 2

## 2020-09-25 MED ORDER — HYDROXYZINE HCL 50 MG PO TABS
25.0000 mg | ORAL_TABLET | Freq: Three times a day (TID) | ORAL | Status: DC | PRN
  Administered 2020-09-25 – 2020-09-26 (×2): 25 mg via ORAL
  Filled 2020-09-25 (×2): qty 1

## 2020-09-25 NOTE — Lactation Note (Addendum)
This note was copied from a baby's chart. Lactation Consultation Note  Patient Name: Alison Harrison [id number] Date: 09/25/2020 Reason for consult: Follow-up assessment;Term Age:31 hours  LC in to visit with P2 Mom and GMOB.  Mom is exhausted and very emotionally distraught regarding her traumatic birth.    GMOB is concerned about her emotional health presently.  Mom is acting and feeling as if she is suffering from PTSD.  Mom pumped 20 ml of colostrum, placed in refrigerator to use at next feeding.  Mom states baby latches and feeds well.  Mom is waiting to go for an echocardiogram, EKG was done this am.  LC spoke with RN regarding Mom's emotional condition.  RN spoke with pharmacy and OB about Mom being prescribed an anxiety medication.    Mom aware of lactation support available to her.  Baby is currently sleeping swaddled in crib following circumcision.  Encouraged STS with baby if able.  Lactation Tools Discussed/Used Tools: Pump Breast pump type: Double-Electric Breast Pump  Interventions Interventions: Breast feeding basics reviewed;Skin to skin;Breast massage;Hand express;DEBP;Coconut oil   Consult Status Consult Status: Follow-up Date: 09/26/20 Follow-up type: In-patient    Broadus John 09/25/2020, 2:03 PM

## 2020-09-25 NOTE — Progress Notes (Signed)
Subjective: Postpartum Day 1: Cesarean Delivery Patient reports tolerating PO and no problems voiding.   No weakness.  Feels well  Objective: Vital signs in last 24 hours: Temp:  [98.1 F (36.7 C)-98.7 F (37.1 C)] 98.1 F (36.7 C) (07/23 0421) Pulse Rate:  [61-74] 64 (07/23 0421) Resp:  [11-23] 18 (07/23 0421) BP: (108-135)/(63-77) 117/73 (07/23 0421) SpO2:  [91 %-100 %] 98 % (07/23 0421)  Physical Exam:  General: alert, cooperative, appears stated age, and no distress Lochia: appropriate Uterine Fundus: firm Incision: healing well DVT Evaluation: No evidence of DVT seen on physical exam.  Recent Labs    09/24/20 0019 09/24/20 0310  HGB 9.3* 11.2*  HCT 27.0* 31.9*    Assessment/Plan: Status post Cesarean section. Doing well postoperatively.  Continue current care Circumcision today.  Luz Lex 09/25/2020, 8:37 AM

## 2020-09-25 NOTE — Consult Note (Signed)
Referring Physician: Louretta Shorten, MD  Alison Harrison is an 30 y.o. female.                       Chief Complaint: Chest pain  HPI: 30 years old white female is one day post Cesarean section. She has retrosternal dull chest pain without radiation. She had COVID infection 3 weeks ago.  Her EKG shows non-specific ST-T changes. CT angiogram was negative for pulmonary embolism. Echocardiogram is near normal except mild to moderate TR, probably from fluid overload. Her HS troponin I is pending. Hgb is somewhat down to 9.3 gm. post surgery.  Past Medical History:  Diagnosis Date   Allergy    Chicken pox    GERD (gastroesophageal reflux disease)    Pneumonia 06/2017      Past Surgical History:  Procedure Laterality Date   CESAREAN SECTION N/A 09/23/2020   Procedure: CESAREAN SECTION;  Surgeon: Tyson Dense, MD;  Location: [facility] LD [facility]S;  Service: Obstetrics;  Laterality: N/A;   WISDOM TOOTH EXTRACTION      Family History  Problem Relation Age of Onset   Hypertension Mother    Arthritis Maternal Grandmother    Alcohol abuse Maternal Grandfather    Diabetes Maternal Grandfather    Cancer Paternal Grandmother        breast    Cancer Paternal Grandfather        prostate    Miscarriages / Stillbirths Sister    Social History:  reports that she has never smoked. She has never used smokeless tobacco. She reports previous alcohol use. She reports that she does not use drugs.  Allergies:  Allergies  Allergen Reactions   Tetracycline Hives    Sob    Medications Prior to Admission  Medication Sig Dispense Refill   acetaminophen (TYLENOL) 500 MG tablet Take 500 mg by mouth every 4 (four) hours as needed for mild pain.     calcium carbonate (TUMS EX) 750 MG chewable tablet Chew 2 tablets by mouth daily as needed for heartburn.     fluticasone (FLONASE) 50 MCG/ACT nasal spray Place 1 spray into both nostrils daily as needed for allergies or rhinitis.     Prenatal Vit-Fe Fumarate-FA  (PRENATAL MULTIVITAMIN) TABS tablet Take 1 tablet by mouth daily at 12 noon.     sodium chloride (OCEAN) 0.65 % SOLN nasal spray Place 1 spray into both nostrils as needed for congestion. 30 mL 2   loperamide (IMODIUM) 2 MG capsule Take 1 capsule (2 mg total) by mouth 4 (four) times daily as needed for diarrhea or loose stools. (Patient not taking: Reported on 09/24/2020) 12 capsule 0   ondansetron (ZOFRAN ODT) 4 MG disintegrating tablet Take 1-2 tablets (4-8 mg total) by mouth every 6 (six) hours as needed for nausea or vomiting. (Patient not taking: Reported on 09/24/2020) 20 tablet 0    Results for orders placed or performed during the hospital encounter of 09/23/20 (from the past 48 hour(s))  Type and screen [place]     Status: None   Collection Time: 09/23/20  4:15 PM  Result Value Ref Range   ABO/RH(D) A POS    Antibody Screen NEG    Sample Expiration      09/26/2020,2359 Performed at [facility], [place] [address], [place], [place] [address]   CBC     Status: Abnormal   Collection Time: 09/23/20  4:24 PM  Result Value Ref Range   WBC  6.9 4.0 - 10.5 K/uL   RBC 3.77 (L) 3.87 - 5.11 MIL/uL   Hemoglobin 12.0 12.0 - 15.0 g/dL   HCT 34.5 (L) 36.0 - 46.0 %   MCV 91.5 80.0 - 100.0 fL   MCH 31.8 26.0 - 34.0 pg   MCHC 34.8 30.0 - 36.0 g/dL   RDW 11.9 11.5 - 15.5 %   Platelets 148 (L) 150 - 400 K/uL   nRBC 0.0 0.0 - 0.2 %    Comment: Performed at [facility], [place] [address] [place], [place] [address]  RPR     Status: None   Collection Time: 09/23/20  4:24 PM  Result Value Ref Range   RPR Ser Ql NON REACTIVE NON REACTIVE    Comment: Performed at [facility], [place] [address], [place], [place] [address]  CBC     Status: Abnormal   Collection Time: 09/24/20 12:19 AM  Result Value Ref Range   WBC 10.3 4.0 - 10.5 K/uL   RBC 2.93 (L) 3.87 - 5.11 MIL/uL   Hemoglobin 9.3 (L) 12.0 - 15.0 g/dL   HCT 27.0 (L) 36.0 - 46.0 %   MCV 92.2 80.0 - 100.0 fL    MCH 31.7 26.0 - 34.0 pg   MCHC 34.4 30.0 - 36.0 g/dL   RDW 11.9 11.5 - 15.5 %   Platelets 101 (L) 150 - 400 K/uL    Comment: Immature Platelet Fraction may be clinically indicated, consider ordering this additional test [id number] REPEATED TO VERIFY    nRBC 0.0 0.0 - 0.2 %    Comment: Performed at [facility], [place] [address], [place], [place] [address]  Protime-INR     Status: None   Collection Time: 09/24/20 12:19 AM  Result Value Ref Range   Prothrombin Time 14.0 11.4 - 15.2 seconds   INR 1.1 0.8 - 1.2    Comment: (NOTE) INR goal varies based on device and disease states. Performed at [facility], [place] [address], [place], [place] [address]   Fibrinogen     Status: None   Collection Time: 09/24/20 12:19 AM  Result Value Ref Range   Fibrinogen 314 210 - 475 mg/dL    Comment: (NOTE) Fibrinogen results may be underestimated in patients receiving thrombolytic therapy. Performed at [facility], [place] [address] [place], [place] [address]   Glucose, capillary     Status: None   Collection Time: 09/24/20  2:03 AM  Result Value Ref Range   Glucose-Capillary 96 70 - 99 mg/dL    Comment: Glucose reference range applies only to samples taken after fasting for at least 8 hours.  MRSA Next Gen by PCR, Nasal     Status: None   Collection Time: 09/24/20  2:18 AM   Specimen: Nasal Mucosa; Nasal Swab  Result Value Ref Range   MRSA by PCR Next Gen NOT DETECTED NOT DETECTED    Comment: (NOTE) The GeneXpert MRSA Assay (FDA approved for NASAL specimens only), is one component of a comprehensive MRSA colonization surveillance program. It is not intended to diagnose MRSA infection nor to guide or monitor treatment for MRSA infections. Test performance is not FDA approved in patients less than 64 years old. Performed at [facility], [place] [address], [place], [place] [address]   DIC Panel ONCE - STAT     Status: Abnormal   Collection Time: 09/24/20  3:10 AM   Result Value Ref Range   Prothrombin Time  13.0 11.4 - 15.2 seconds   INR 1.0 0.8 - 1.2    Comment: (NOTE) INR goal varies based on device and disease states.    aPTT 25 24 - 36 seconds   Fibrinogen 449 210 - 475 mg/dL    Comment: (NOTE) Fibrinogen results may be underestimated in patients receiving thrombolytic therapy.    D-Dimer, Quant 2.70 (H) 0.00 - 0.50 ug/mL-FEU    Comment: (NOTE) At the manufacturer cut-off value of 0.5 g/mL FEU, this assay has a negative predictive value of 95-100%.This assay is intended for use in conjunction with a clinical pretest probability (PTP) assessment model to exclude pulmonary embolism (PE) and deep venous thrombosis (DVT) in outpatients suspected of PE or DVT. Results should be correlated with clinical presentation.    Platelets 123 (L) 150 - 400 K/uL   Smear Review NO SCHISTOCYTES SEEN     Comment: Performed at [facility], [place] [address], [place], [place] [address]  CBC     Status: Abnormal   Collection Time: 09/24/20  3:10 AM  Result Value Ref Range   WBC 12.5 (H) 4.0 - 10.5 K/uL   RBC 3.49 (L) 3.87 - 5.11 MIL/uL   Hemoglobin 11.2 (L) 12.0 - 15.0 g/dL   HCT 31.9 (L) 36.0 - 46.0 %   MCV 91.4 80.0 - 100.0 fL   MCH 32.1 26.0 - 34.0 pg   MCHC 35.1 30.0 - 36.0 g/dL   RDW 11.9 11.5 - 15.5 %   Platelets 132 (L) 150 - 400 K/uL   nRBC 0.0 0.0 - 0.2 %    Comment: Performed at [facility], [place] [address], [place], [place] [address]   CT Angio Chest Pulmonary Embolism (PE) W or WO Contrast  Result Date: 09/23/2020 CLINICAL DATA:  PE suspected, high prob Shortness of breath. EXAM: CT ANGIOGRAPHY CHEST WITH CONTRAST TECHNIQUE: Multidetector CT imaging of the chest was performed using the standard protocol during bolus administration of intravenous contrast. Multiplanar CT image reconstructions and MIPs were obtained to evaluate the vascular anatomy. CONTRAST:  33m OMNIPAQUE IOHEXOL 350 MG/ML SOLN COMPARISON:  None available.  FINDINGS: Cardiovascular: There are no filling defects within the pulmonary arteries to suggest pulmonary embolus. Normal heart size. Normal caliber thoracic aorta without dissection or acute aortic findings. Left vertebral artery arises directly from the thoracic aorta no pericardial effusion. Mediastinum/Nodes: No mediastinal or hilar adenopathy. Distal esophagus is minimally patulous. No esophageal wall thickening. No thyroid nodule. Lungs/Pleura: Clear lungs. No acute or focal airspace disease. No findings of pulmonary edema. No pleural effusion trachea and central bronchi are patent. Upper Abdomen: No acute findings. Musculoskeletal: There are no acute or suspicious osseous abnormalities. Review of the MIP images confirms the above findings. IMPRESSION: No pulmonary embolus or acute intrathoracic abnormality. Electronically Signed   By: MKeith RakeM.D.   On: 09/23/2020 23:41   ECHOCARDIOGRAM COMPLETE  Result Date: 09/25/2020    ECHOCARDIOGRAM REPORT   Patient Name:   Alison PILZDate of Exam: 09/25/2020 Medical Rec #:  [id number]     Height:       67.0 in Accession #:    [id number]    Weight:       174.9 lb Date of Birth:  [birth year]     BSA:          1.910 m Patient Age:    30 years       BP:  139/85 mmHg Patient Gender: F              HR:           80 bpm. Exam Location:  Inpatient Procedure: 2D Echo Indications:    R07.2 Precordial pain  History:        Patient has no prior history of Echocardiogram examinations.                 Signs/Symptoms:Chest Pain.  Sonographer:    Clayton Lefort RDCS (AE) Referring Phys: [place]  1. Left ventricular ejection fraction, by estimation, is 55 to 60%. The left ventricle has normal function. The left ventricle has no regional wall motion abnormalities. Left ventricular diastolic parameters were normal.  2. Right ventricular systolic function is normal. The right ventricular size is normal.  3. Left atrial size was mildly dilated.  4.  Right atrial size was moderately dilated.  5. The mitral valve is normal in structure. Mild mitral valve regurgitation.  6. Tricuspid valve regurgitation is mild to moderate.  7. The aortic valve is tricuspid. Aortic valve regurgitation is not visualized.  8. The inferior vena cava is dilated in size with <50% respiratory variability, suggesting right atrial pressure of 15 mmHg. FINDINGS  Left Ventricle: Left ventricular ejection fraction, by estimation, is 55 to 60%. The left ventricle has normal function. The left ventricle has no regional wall motion abnormalities. The left ventricular internal cavity size was normal in size. There is  no left ventricular hypertrophy. Left ventricular diastolic parameters were normal. Right Ventricle: The right ventricular size is normal. No increase in right ventricular wall thickness. Right ventricular systolic function is normal. Left Atrium: Left atrial size was mildly dilated. Right Atrium: Right atrial size was moderately dilated. Pericardium: There is no evidence of pericardial effusion. Mitral Valve: The mitral valve is normal in structure. Mild mitral valve regurgitation. MV peak gradient, 6.2 mmHg. The mean mitral valve gradient is 2.0 mmHg. Tricuspid Valve: The tricuspid valve is normal in structure. Tricuspid valve regurgitation is mild to moderate. Aortic Valve: The aortic valve is tricuspid. Aortic valve regurgitation is not visualized. Aortic valve mean gradient measures 3.0 mmHg. Aortic valve peak gradient measures 5.3 mmHg. Aortic valve area, by VTI measures 2.48 cm. Pulmonic Valve: The pulmonic valve was normal in structure. Pulmonic valve regurgitation is not visualized. Aorta: The aortic root is normal in size and structure. Venous: The inferior vena cava is dilated in size with less than 50% respiratory variability, suggesting right atrial pressure of 15 mmHg. IAS/Shunts: No atrial level shunt detected by color flow Doppler.  LEFT VENTRICLE PLAX 2D LVIDd:          3.80 cm  Diastology LVIDs:         2.70 cm  LV e' medial:    14.90 cm/s LV PW:         1.00 cm  LV E/e' medial:  5.9 LV IVS:        0.90 cm  LV e' lateral:   17.70 cm/s LVOT diam:     1.80 cm  LV E/e' lateral: 5.0 LV SV:         56 LV SV Index:   29 LVOT Area:     2.54 cm  RIGHT VENTRICLE             IVC RV Basal diam:  3.40 cm     IVC diam: 1.40 cm RV S prime:     16.10 cm/s TAPSE (  M-mode): 2.1 cm LEFT ATRIUM             Index       RIGHT ATRIUM           Index LA diam:        3.30 cm 1.73 cm/m  RA Area:     17.90 cm LA Vol (A2C):   66.9 ml 35.02 ml/m RA Volume:   60.30 ml  31.57 ml/m LA Vol (A4C):   39.7 ml 20.78 ml/m LA Biplane Vol: 52.4 ml 27.43 ml/m  AORTIC VALVE AV Area (Vmax):    2.48 cm AV Area (Vmean):   2.32 cm AV Area (VTI):     2.48 cm AV Vmax:           115.00 cm/s AV Vmean:          78.900 cm/s AV VTI:            0.227 m AV Peak Grad:      5.3 mmHg AV Mean Grad:      3.0 mmHg LVOT Vmax:         112.00 cm/s LVOT Vmean:        71.800 cm/s LVOT VTI:          0.221 m LVOT/AV VTI ratio: 0.97  AORTA Ao Root diam: 2.60 cm Ao Asc diam:  2.70 cm MITRAL VALVE               TRICUSPID VALVE MV Area (PHT): 3.21 cm    TR Peak grad:   20.6 mmHg MV Area VTI:   2.15 cm    TR Vmax:        227.00 cm/s MV Peak grad:  6.2 mmHg MV Mean grad:  2.0 mmHg    SHUNTS MV Vmax:       1.24 m/s    Systemic VTI:  0.22 m MV Vmean:      58.4 cm/s   Systemic Diam: 1.80 cm MV Decel Time: 236 msec MV E velocity: 88.60 cm/s MV A velocity: 53.50 cm/s MV E/A ratio:  1.66 Dixie Dials MD Electronically signed by Dixie Dials MD Signature Date/Time: 09/25/2020/3:49:37 PM    Final     Review Of Systems Constitutional: No fever, chills, weight loss or gain. Eyes: No vision change, wears glasses. No discharge or pain. Ears: No hearing loss, No tinnitus. Respiratory: No asthma, COPD, pneumonias. Positive shortness of breath. No hemoptysis. Cardiovascular: Positive chest pain, palpitation, leg edema. Gastrointestinal: No  nausea, vomiting, diarrhea, constipation. No GI bleed. No hepatitis. Genitourinary: No dysuria, hematuria, kidney stone. No incontinance. Neurological: No headache, stroke, seizures.  Psychiatry: No psych facility admission for anxiety, depression, suicide. No detox. Skin: No rash. Musculoskeletal: No joint pain, fibromyalgia. No neck pain, back pain. Lymphadenopathy: No lymphadenopathy. Hematology: No anemia or easy bruising.   Blood pressure 139/85, pulse 89, temperature 98.9 F (37.2 C), temperature source Oral, resp. rate 18, height '5\' 7"'$  (1.702 m), weight 79.3 kg, SpO2 97 %, unknown if currently breastfeeding. Body mass index is 27.39 kg/m. General appearance: alert, cooperative, appears stated age and mild respiratory distress Head: Normocephalic, atraumatic. Eyes: Hazel eyes, pink conjunctiva, corneas clear.  Neck: No adenopathy, no carotid bruit, no JVD, supple, symmetrical, trachea midline and thyroid not enlarged. Resp: Clearing to auscultation bilaterally. Minimal wheeze with cough. Cardio: Regular rate and rhythm, S1, S2 normal, II/VI systolic murmur, no click, rub or gallop GI: Soft, non-tender; bowel sounds normal; no organomegaly. Extremities: Trace edema, no cyanosis or clubbing.  Skin: Warm and dry.  Neurologic: Alert and oriented X 3, normal strength.  Assessment/Plan Chest pain S/P Cesarean delivery Mild anemia of blood loss H/O COVID infection  Plan: Check CBC, BMET in AM Awaiting troponin I result.   Time spent: Review of old records, Lab, x-rays, EKG, other cardiac tests, examination, discussion with patient/Family/referring doctor over 70 minutes.  Birdie Riddle, MD  09/25/2020, 3:51 PM

## 2020-09-25 NOTE — Progress Notes (Signed)
Situation: Chaplain Alison Harrison responded to page from RN for spiritual/emotional support for pt Alison Harrison following an emergency C-section.  Background: Facts: [name] shared that she had an "emergency C-section" when delivering her son, Alison Harrison (spelling?). She shared that she had an "embolism" and that the type of procedure the doctor had to perform was one where "only 20% of mothers survive". Family: RN shared that the child was in a circumcision procedure at the time of the visit. [name] husband, Alison Harrison, was present for portions of today's visit. [name] shared that they have another son, Alison Harrison, who is at home. She also shared that she had 2 miscarriages before having their first son. Feelings: After her first son's birth last year, [name] shared that she and her husband were "feeling good" about Alison Harrison's birth. However, she shared that when they got to the hospital "all of my levels were low". She shared that in the moment "I was worried about my son. I just wanted them to get him out so he'd be safe." But now that [name] has had time to reflect on the incident, she shared "I could have died... And now I'm worried about every little thing that I'm feeling... I couldn't fall asleep last night because I was so worried." [name] also described feelings of "impending doom." She shared that she understands "the doctor and everybody telling me that I'm out of the woods and everything is looking good," and yet she still is reflecting on what was a quite scary situation. [name] described after Alison Harrison's birth having "the baby blues" and that "I know there's a lot of hormones right now" and that generally "I feel like a mess".  Faith: In addition to these feelings, [name] frequently described throughout the visit feeling "thankful that God was with me and with Alison Harrison through it all." She also shared "I know I should feel thankful, that other people have it way worse than me." Ms.  Alison Harrison self-identifies as "a person of faith." She shared that she reminded herself of Psalm 23 when en route to the ICU yesterday and that it brought her peace. [name] also described a deep understanding of her faith in God, sharing moments when God has been present with her throughout her pregnancies, losses, and child-bearing times.  Actions & Assessments: Chaplain offered compassionate presence, validated feelings, incorporated conversation around scripture (especially reflecting on the Psalms as they express a wide range of emotions), and provided space for [name] to verbally process her experience. Chaplain Alison Harrison shared additional scripture for [name] to think about at night when trying to rest: "I will both lie down and fall sleep in peace, because you alone, O Lord, let me live in safety." (Psalm 4:8)  Recommendations: For Chaplains: Continue conversations around God's presence in the midst of life's ups and downs and incorporating scripture, especially the Psalms. For Staff: Continue to be available for [name] to verbally process her experience as needed. Chaplain remains available for follow-up spiritual/emotional support as needed.  Rev. Alison Harrison, MDiv      09/25/20 1300  Clinical Encounter Type  Visited With Patient and family together  Visit Type Initial;Spiritual support;Post-op  Referral From Nurse  Spiritual Encounters  Spiritual Needs Prayer;Emotional;Sacred text

## 2020-09-26 ENCOUNTER — Inpatient Hospital Stay (HOSPITAL_COMMUNITY): Admit: 2020-09-26 | Payer: Self-pay

## 2020-09-26 LAB — BASIC METABOLIC PANEL
Anion gap: 5 (ref 5–15)
BUN: 8 mg/dL (ref 6–20)
CO2: 27 mmol/L (ref 22–32)
Calcium: 8.7 mg/dL — ABNORMAL LOW (ref 8.9–10.3)
Chloride: 105 mmol/L (ref 98–111)
Creatinine, Ser: 0.73 mg/dL (ref 0.44–1.00)
GFR, Estimated: >60 mL/min (ref >60)
Glucose, Bld: 110 mg/dL — ABNORMAL HIGH (ref 70–99)
Potassium: 3.7 mmol/L (ref 3.5–5.1)
Sodium: 137 mmol/L (ref 135–145)

## 2020-09-26 LAB — CBC
HCT: 32.8 % — ABNORMAL LOW (ref 36.0–46.0)
Hemoglobin: 10.8 g/dL — ABNORMAL LOW (ref 12.0–15.0)
MCH: 31.5 pg (ref 26.0–34.0)
MCHC: 32.9 g/dL (ref 30.0–36.0)
MCV: 95.6 fL (ref 80.0–100.0)
Platelets: 150 10*3/uL (ref 150–400)
RBC: 3.43 MIL/uL — ABNORMAL LOW (ref 3.87–5.11)
RDW: 12.2 % (ref 11.5–15.5)
WBC: 5.9 10*3/uL (ref 4.0–10.5)
nRBC: 0 % (ref 0.0–0.2)

## 2020-09-26 MED ORDER — HYDROXYZINE HCL 25 MG PO TABS: 25.0000 mg | ORAL_TABLET | Freq: Three times a day (TID) | ORAL | 0 refills | Status: DC | PRN

## 2020-09-26 MED ORDER — IBUPROFEN 600 MG PO TABS: 600.0000 mg | ORAL_TABLET | Freq: Four times a day (QID) | ORAL | 0 refills | Status: DC | PRN

## 2020-09-26 NOTE — Consult Note (Signed)
Ref: [name], [name], MD   Subjective:  Feeling better. Troponin I levels are normal. Hgb and BMET are stable. Echocardiogram showed normal LV systolic function. Mild MR and mild to moderate TR. VS normal.  Objective:  Vital Signs in the last 24 hours: Temp:  [98 F (36.7 C)-98.3 F (36.8 C)] 98 F (36.7 C) (07/24 0820) Pulse Rate:  [75-94] 75 (07/24 0820) Resp:  [16-20] 18 (07/24 0820) BP: (105-130)/(74-82) 123/80 (07/24 0820) SpO2:  [99 %] 99 % (07/24 0820)  Physical Exam: BP Readings from Last 1 Encounters:  09/26/20 123/80     Wt Readings from Last 1 Encounters:  09/23/20 79.3 kg    Weight change:  Body mass index is 27.39 kg/m. HEENT: [place]/AT, Eyes-Hazel, Conjunctiva-Pink, Sclera-Non-icteric Neck: No JVD, No bruit, Trachea midline. Lungs:  Clearing, Bilateral. Cardiac:  Regular rhythm, normal S1 and S2, no S3. II/VI systolic murmur. Abdomen:  Soft, non-tender. BS present. Extremities:  Trace edema present. No cyanosis. No clubbing. CNS: AxOx3, Cranial nerves grossly intact, moves all 4 extremities.  Skin: Warm and dry.   Intake/Output from previous day: 07/23 0701 - 07/24 0700 In: -  Out: 400 [Urine:400]    Lab Results: BMET    Component Value Date/Time   NA 137 09/26/2020 0516   NA 133 (L) 05/25/2020 1522   NA 135 01/17/2019 1107   K 3.7 09/26/2020 0516   K 3.3 (L) 05/25/2020 1522   K 3.9 01/17/2019 1107   CL 105 09/26/2020 0516   CL 103 05/25/2020 1522   CL 103 01/17/2019 1107   CO2 27 09/26/2020 0516   CO2 23 05/25/2020 1522   CO2 21 (L) 01/17/2019 1107   GLUCOSE 110 (H) 09/26/2020 0516   GLUCOSE 101 (H) 05/25/2020 1522   GLUCOSE 81 01/17/2019 1107   BUN 8 09/26/2020 0516   BUN 14 05/25/2020 1522   BUN 7 01/17/2019 1107   CREATININE 0.73 09/26/2020 0516   CREATININE 0.57 05/25/2020 1522   CREATININE 0.74 01/17/2019 1107   CALCIUM 8.7 (L) 09/26/2020 0516   CALCIUM 8.3 (L) 05/25/2020 1522   CALCIUM 8.7 (L) 01/17/2019 1107    GFRNONAA >60 09/26/2020 0516   GFRNONAA >60 05/25/2020 1522   GFRNONAA >60 01/17/2019 1107   GFRAA >60 01/17/2019 1107   CBC    Component Value Date/Time   WBC 5.9 09/26/2020 0516   RBC 3.43 (L) 09/26/2020 0516   HGB 10.8 (L) 09/26/2020 0516   HCT 32.8 (L) 09/26/2020 0516   PLT 150 09/26/2020 0516   MCV 95.6 09/26/2020 0516   MCH 31.5 09/26/2020 0516   MCHC 32.9 09/26/2020 0516   RDW 12.2 09/26/2020 0516   LYMPHSABS 0.2 (L) 05/25/2020 1522   MONOABS 0.2 05/25/2020 1522   EOSABS 0.0 05/25/2020 1522   BASOSABS 0.0 05/25/2020 1522   HEPATIC Function Panel No results for input(s): PROT in the last 8760 hours.  Invalid input(s):  ALBUMIN,  AST,  ALT,  ALKPHOS,  BILIDIR,  IBILI HEMOGLOBIN A1C No components found for: HGA1C,  MPG CARDIAC ENZYMES No results found for: CKTOTAL, CKMB, CKMBINDEX, TROPONINI BNP No results for input(s): PROBNP in the last 8760 hours. TSH No results for input(s): TSH in the last 8760 hours. CHOLESTEROL No results for input(s): CHOL in the last 8760 hours.  Scheduled Meds:  acetaminophen  1,000 mg Oral Q6H   ibuprofen  600 mg Oral Q6H   prenatal multivitamin  1 tablet Oral Q1200   scopolamine  1 patch Transdermal Once  senna-docusate  2 tablet Oral Daily   simethicone  80 mg Oral TID PC   Tdap  0.5 mL Intramuscular Once   Continuous Infusions:  lactated ringers Stopped (09/24/20 0327)   lactated ringers 125 mL/hr at 09/24/20 1057   naLOXone (NARCAN) adult infusion for PRURITIS     PRN Meds:.coconut oil, witch hazel-glycerin **AND** dibucaine, diphenhydrAMINE, HYDROmorphone (DILAUDID) injection, hydrOXYzine, LORazepam, menthol-cetylpyridinium, nalbuphine **OR** nalbuphine, nalbuphine **OR** nalbuphine, naloxone **AND** sodium chloride flush, naLOXone (NARCAN) adult infusion for PRURITIS, [PENDING] ondansetron **OR** ondansetron (ZOFRAN) IV, oxyCODONE, simethicone, zolpidem  Assessment/Plan: Chest pain, non-cardiac S/P Cesarean delivery S/P  COVID infection  Plan: Increase activity as tolerated. Repeat echocardiogram in 1 month at office f/u. Avoid strenuous activity.   LOS: 3 days   Time spent including chart review, lab review, examination, discussion with patient/Nurse/Dr. Corinna Capra : 30 min   Dixie Dials  MD  09/26/2020, 3:42 PM

## 2020-09-26 NOTE — Discharge Summary (Signed)
Postpartum Discharge Summary       Patient Name: Alison Harrison DOB: Aug 30, 1990 MRN: [id number]  Date of admission: 09/23/2020 Delivery date:09/23/2020  Delivering provider: Tyson Dense  Date of discharge: 09/26/2020  Admitting diagnosis: Pregnancy [Z34.90] Intrauterine pregnancy: [redacted]w[redacted]d    Secondary diagnosis:  Active Problems:   Pregnancy   Hypotension   Bradycardia   Amniotic fluid embolism during pregnancy  Additional problems: anxiety    Discharge diagnosis: Active Problems:   Pregnancy   Hypotension   Bradycardia   Amniotic fluid embolism during pregnancy                                              Post partum procedures: EKG, ECHO Augmentation: AROM, Pitocin, and Cytotec Complications: Maternal bradycardia and possible AFE  Hospital course: Induction of Labor With Cesarean Section   30y.o. yo [id number] admitted to the hospital 09/23/2020 for induction of labor. Patient had a labor course significant for maternal bradycardia requring maternal resusitation and prompting cesarean section. The patient went for cesarean section due to  maternal bradycardia . Delivery details are as follows: Membrane Rupture Time/Date: 5:44 PM ,09/23/2020   Delivery Method:C-Section, Low Transverse  Details of operation can be found in separate operative Note.  Patient had an complicated postpartum course requiring Cardiology consultation with ECHO, EKG and labs. . She is ambulating, tolerating a regular diet, passing flatus, and urinating well.  Patient is discharged home in stable condition on 09/26/20.      Newborn Data: Birth date:09/23/2020  Birth time:11:51 PM  Gender:Female  Living status:Living  Apgars:9 ,10  Weight:3368 g                                Magnesium Sulfate received: No BMZ received: No Rhophylac:N/A MMR:N/A T-DaP:Given prenatally Flu: Yes Transfusion:No  Physical exam  Vitals:   09/25/20 1325 09/25/20 1743 09/25/20 2213 09/25/20 2359   BP:  105/82 130/80 119/74  Pulse:  94 90 76  Resp:  _0 Temp:  98.3 F (36.8 C) 98.2 F (36.8 C) 98.3 F (36.8 C)  TempSrc:  Oral Oral   SpO2: 97%  99% 99%  Weight:      Height:       General: alert, cooperative, and no distress Lochia: appropriate Uterine Fundus: firm Incision: Healing well with no significant drainage DVT Evaluation: No evidence of DVT seen on physical exam. Labs: Lab Results  Component Value Date   WBC 5.9 09/26/2020   HGB 10.8 (L) 09/26/2020   HCT 32.8 (L) 09/26/2020   MCV 95.6 09/26/2020   PLT 150 09/26/2020   CMP Latest Ref Rng & Units 09/26/2020  Glucose 70 - 99 mg/dL 110(H)  BUN 6 - 20 mg/dL 8  Creatinine 0.44 - 1.00 mg/dL 0.73  Sodium 135 - 145 mmol/L 137  Potassium 3.5 - 5.1 mmol/L 3.7  Chloride 98 - 111 mmol/L 105  CO2 22 - 32 mmol/L 27  Calcium 8.9 - 10.3 mg/dL 8.7(L)  Total Protein 6.5 - 8.1 g/dL -  Total Bilirubin 0.3 - 1.2 mg/dL -  Alkaline Phos 38 - 126 U/L -  AST 15 - 41 U/L -  ALT 0 - 44 U/L -   Edinburgh Score: Edinburgh Postnatal Depression Scale Screening Tool 09/24/2020  I have been able to laugh and see the funny side of things. (No Data)  I have looked forward with enjoyment to things. -  I have blamed myself unnecessarily when things went wrong. -  I have been anxious or worried for no good reason. -  I have felt scared or panicky for no good reason. -  Things have been getting on top of me. -  I have been so unhappy that I have had difficulty sleeping. -  I have felt sad or miserable. -  I have been so unhappy that I have been crying. -  The thought of harming myself has occurred to me. Flavia Shipper Postnatal Depression Scale Total -      After visit meds:  Allergies as of 09/26/2020       Reactions   Tetracycline Hives   Sob        Medication List     TAKE these medications    acetaminophen 500 MG tablet Commonly known as: TYLENOL Take 500 mg by mouth every 4 (four) hours as needed for mild  pain.   calcium carbonate 750 MG chewable tablet Commonly known as: TUMS EX Chew 2 tablets by mouth daily as needed for heartburn.   fluticasone 50 MCG/ACT nasal spray Commonly known as: FLONASE Place 1 spray into both nostrils daily as needed for allergies or rhinitis.   hydrOXYzine 25 MG tablet Commonly known as: ATARAX/VISTARIL Take 1 tablet (25 mg total) by mouth 3 (three) times daily as needed for anxiety.   ibuprofen 600 MG tablet Commonly known as: ADVIL Take 1 tablet (600 mg total) by mouth every 6 (six) hours as needed for mild pain, moderate pain or cramping.   loperamide 2 MG capsule Commonly known as: IMODIUM Take 1 capsule (2 mg total) by mouth 4 (four) times daily as needed for diarrhea or loose stools.   ondansetron 4 MG disintegrating tablet Commonly known as: Zofran ODT Take 1-2 tablets (4-8 mg total) by mouth every 6 (six) hours as needed for nausea or vomiting.   prenatal multivitamin Tabs tablet Take 1 tablet by mouth daily at 12 noon.   sodium chloride 0.65 % Soln nasal spray Commonly known as: OCEAN Place 1 spray into both nostrils as needed for congestion.         Discharge home in stable condition Infant Feeding: Breast Infant Disposition:home with mother Discharge instruction: per After Visit Summary and Postpartum booklet. Activity: Advance as tolerated. Pelvic rest for 6 weeks.  Diet: routine diet Anticipated Birth Control: Unsure Postpartum Appointment: 1 week in OB office, 4 weeks cardiology Additional Postpartum F/U: BP check 1 week Future Appointments: Follow up Visit:  Follow-up Information     Dixie Dials, MD. Schedule an appointment as soon as possible for a visit in 1 month(s).   Specialty: Cardiology Contact information: [address] [place] [address] [phone]                     09/26/2020 Luz Lex, MD

## 2020-09-26 NOTE — Lactation Note (Signed)
This note was copied from a baby's chart. Lactation Consultation Note  Patient Name: Alison Harrison [id number] Date: 09/26/2020 Reason for consult: Follow-up assessment;Term Age:31 hours  Mom resting in bed, dad sitting on couch, baby swaddled and asleep on back in bassinet. Baby with 5.58% weight loss, mom unsure if should continue to pump after every feeding or allow baby to latch only. Reports baby with great latch, "not like by first", continues to work on ensuring baby has deep enough latch. Reports baby fed ~1hr prior to [facility] entering, fed from one side, refused other side when offered, pumped 41ms from opposite breast. Denies plans to be separated from baby in near future, stay at home mom.  Advised latch to breast unrestricted, pump for comfort, avoid bottles unless indicated. EBM to nipples bilat followed by coconut oil PRN, continue with Comfort Gels PRN. Advised cramping during feeding wnl at this time. Reinforced feeding frequency, 8-12 daily, expected length of feeds, signs of adequate milk transfer, skin to skin, engorgement and how to manage, milk storage guidelines.   Offered to observe latch prior to discharge, mom will call if desired. Parents with no further requests. Left the room with baby still asleep. BGilliam, RN, IBCLC   Feeding Mother's Current Feeding Choice: Breast Milk   Interventions Interventions: Breast feeding basics reviewed  Discharge Discharge Education: Engorgement and breast care;Warning signs for feeding baby  Consult Status Consult Status: Complete Date: 09/26/20    BBernita Buffy7/24/2022, 11:54 AM

## 2020-09-26 NOTE — Progress Notes (Signed)
Reviewed discharge instructions with patient regarding medications, when to call MD/go to [facility]U, signs and symptoms of pre-e, postpartum course, C-section site care, and to follow up as scheduled with OB in 1 week, and 4-6 weeks for postpartum check, and within 1 month with cardiology. Patient verbalized understanding of discharge postpartum instructions and asked appropriate questions.

## 2020-09-26 NOTE — Progress Notes (Signed)
Subjective: Postpartum Day 2: Cesarean Delivery Patient reports tolerating PO, + flatus, and no problems voiding.   Had CP yesterday and normal cardiology wu.  Thinks from anxiety and responded to vistaril.  Desires d/c home  Objective: Vital signs in last 24 hours: Temp:  [98.2 F (36.8 C)-98.9 F (37.2 C)] 98.3 F (36.8 C) (07/23 2359) Pulse Rate:  [76-94] 76 (07/23 2359) Resp:  [16-20] 16 (07/23 2359) BP: (105-139)/(74-85) 119/74 (07/23 2359) SpO2:  [97 %-99 %] 99 % (07/23 2359)  Physical Exam:  General: alert, cooperative, appears stated age, and no distress Lochia: appropriate Uterine Fundus: firm Incision: healing well DVT Evaluation: No evidence of DVT seen on physical exam.  Recent Labs    09/25/20 1619 09/26/20 0516  HGB 9.6* 10.8*  HCT 28.4* 32.8*    Assessment/Plan: Status post Cesarean section. , Chest pain resolved and normal vitals and sxs. Normal cardiology workup per Dr. Doylene Canard including labs, EKG and ECHO He states she's fine for d/c with fu with him in 4 weeks  Discharge home with standard precautions and return to office 1 week   Alison Harrison 09/26/2020, 9:37 AM

## 2020-09-28 LAB — SURGICAL PATHOLOGY

## 2020-10-05 ENCOUNTER — Telehealth (HOSPITAL_COMMUNITY): Payer: Self-pay | Admitting: *Deleted

## 2020-10-05 NOTE — Telephone Encounter (Signed)
Hospital discharge follow-up call attempted. Left message for patient to return RN call. Erline Levine, RN 10/05/20, [phone].

## 2020-11-18 ENCOUNTER — Telehealth: Payer: Self-pay

## 2020-11-18 NOTE — Telephone Encounter (Signed)
NOTES ON FILE FROM Lucillie Garfinkel MD [phone] SENT REFERRAL TO SCHEDULING

## 2020-12-06 ENCOUNTER — Other Ambulatory Visit: Payer: Self-pay

## 2020-12-06 ENCOUNTER — Ambulatory Visit (INDEPENDENT_AMBULATORY_CARE_PROVIDER_SITE_OTHER): Payer: 59 | Admitting: Family Medicine

## 2020-12-06 ENCOUNTER — Encounter: Payer: Self-pay | Admitting: Family Medicine

## 2020-12-06 VITALS — BP 120/80 | HR 84 | Temp 97.0°F | Wt 141.5 lb

## 2020-12-06 DIAGNOSIS — Z91011 Allergy to milk products, unspecified: Secondary | ICD-10-CM | POA: Insufficient documentation

## 2020-12-06 DIAGNOSIS — Z91012 Allergy to eggs: Secondary | ICD-10-CM | POA: Diagnosis not present

## 2020-12-06 DIAGNOSIS — R42 Dizziness and giddiness: Secondary | ICD-10-CM | POA: Diagnosis not present

## 2020-12-06 DIAGNOSIS — D649 Anemia, unspecified: Secondary | ICD-10-CM | POA: Diagnosis not present

## 2020-12-06 HISTORY — DX: Allergy to milk products, unspecified: Z91.0110

## 2020-12-06 HISTORY — DX: Dizziness and giddiness: R42

## 2020-12-06 LAB — COMPREHENSIVE METABOLIC PANEL
ALT: 24 U/L (ref 0–35)
AST: 23 U/L (ref 0–37)
Albumin: 4.4 g/dL (ref 3.5–5.2)
Alkaline Phosphatase: 54 U/L (ref 39–117)
BUN: 14 mg/dL (ref 6–23)
CO2: 25 mEq/L (ref 19–32)
Calcium: 9.2 mg/dL (ref 8.4–10.5)
Chloride: 105 mEq/L (ref 96–112)
Creatinine, Ser: 0.72 mg/dL (ref 0.40–1.20)
GFR: 111.2 mL/min (ref >60.00)
Glucose, Bld: 80 mg/dL (ref 70–99)
Potassium: 3.9 mEq/L (ref 3.5–5.1)
Sodium: 140 mEq/L (ref 135–145)
Total Bilirubin: 0.3 mg/dL (ref 0.2–1.2)
Total Protein: 6.6 g/dL (ref 6.0–8.3)

## 2020-12-06 LAB — CBC
HCT: 37.8 % (ref 36.0–46.0)
Hemoglobin: 12.7 g/dL (ref 12.0–15.0)
MCHC: 33.7 g/dL (ref 30.0–36.0)
MCV: 90.7 fl (ref 78.0–100.0)
Platelets: 212 10*3/uL (ref 150.0–400.0)
RBC: 4.16 Mil/uL (ref 3.87–5.11)
RDW: 12 % (ref 11.5–15.5)
WBC: 4.3 10*3/uL (ref 4.0–10.5)

## 2020-12-06 MED ORDER — EPINEPHRINE 0.3 MG/0.3ML IJ SOAJ: 0.3000 mg | INTRAMUSCULAR | 0 refills | Status: DC | PRN

## 2020-12-06 NOTE — Assessment & Plan Note (Signed)
Postural tachycardia. Reviewed prior ECHO with some valve disorder and dilated chambers of heart. Advised continue good hydration. Check for anemia today. Slow transitions - including moving legs and salt as needed. Discussed briefly POTS as well as exercise as tolerated - at this point she has not been diagnosed but it could be a possibility and if walking becomes difficult advised looking up POTS exercises. Agree with cardiology referral - has one pending.

## 2020-12-06 NOTE — Assessment & Plan Note (Signed)
Concerning for anaphylaxis - hives + stomach cramping. Epi pen prescribed - discussed ER if she uses it. Cont prn zytrec and pepcid and avoiding. Agree with allergy referral - already placed. Discussed could be covid vs postpartum related. In setting of dizziness mast cell disorder is a possibility. Will defer additional work-up to allergy doctor. Discussed reading labs if new symptoms occurring due to milk protein components - unclear if this is her allergy but will monitor for this.

## 2020-12-06 NOTE — Patient Instructions (Signed)
Your blood pressure looked good for changing, however your heart rate increased.   This is postural tachycardia  Work on exercise as tolerated Continue hydration Keep salty snacks  Slow transitions - move legs before getting out of bed  Glad you are seeing cardiology and allergy specialists

## 2020-12-06 NOTE — Progress Notes (Signed)
Subjective:     Alison Harrison is a 30 y.o. female presenting for Food Intolerance (Eggs and milk x 1.5 weeks. Stomach cramps and nausea with eggs. Hives and rash with milk. ) and Hypotension (BP dropping when she gets up in AM, ever since birth of baby 2 months ago )     HPI  #Allergies - in the past had been able to eggs/milk w/o issues - in college - did have a period of time with milk which caused a rash - allergic to tetracycline - will get hives with hold wind/water - 1.5 weeks with egg and milk  - symptoms: stomach cramps, nausea and hives and severe stomach cramps - has been avoiding foods - taking pepcid/zyrtec - when eating eggs last Wednesday - cramping/Nausea  - pasta with cream sauce - hives and diarrhea  - milk shake - hives worse and stomach pain  - has also been avoiding eggs, milk, gluten  Did get covid ~2 months ago - very sick at that time   Birth hx - went for induction, then had low HR and BP during labor - concern for embolism and had emergency CT scan and c-section - everyone was OK - diagnosed with amnionic fluid embolism  - did have some chest discomfort after delivery  - tricuspid regurgitation  Endorses dizziness - will get it in the morning when she gets out of the bed or when she is getting up to feel the baby - will feel cold and clammy - is breast feeding - will also feel a little dizzy with head turning - breast feeding - water - feeling constantly thirsty, body armor - drinks 120 oz - diet - does consume salty foods - regular diet - recently decreased - saw cardiology a few weeks ago - AJ Kadakia  - no increase in urination  Review of Systems   Social History   Tobacco Use  Smoking Status Never  Smokeless Tobacco Never        Objective:    BP Readings from Last 3 Encounters:  12/06/20 120/80  09/26/20 123/80  05/25/20 103/62   Wt Readings from Last 3 Encounters:  12/06/20 141 lb 8 oz (64.2 kg)  09/23/20 174 lb  14.4 oz (79.3 kg)  05/25/20 143 lb (64.9 kg)    BP 120/80   Pulse 84   Temp (!) 97 F (36.1 C) (Temporal)   Wt 141 lb 8 oz (64.2 kg)   SpO2 98%   Breastfeeding Yes   BMI 22.16 kg/m   HR 77 laying, 89 sitting, 111 standing  Physical Exam Constitutional:      General: She is not in acute distress.    Appearance: She is well-developed. She is not diaphoretic.  HENT:     Head: Normocephalic and atraumatic.     Right Ear: External ear normal.     Left Ear: External ear normal.  Eyes:     Conjunctiva/sclera: Conjunctivae normal.  Cardiovascular:     Rate and Rhythm: Normal rate and regular rhythm.     Heart sounds: No murmur heard. Pulmonary:     Effort: Pulmonary effort is normal. No respiratory distress.     Breath sounds: Normal breath sounds. No wheezing.  Musculoskeletal:     Cervical back: Neck supple.  Skin:    General: Skin is warm and dry.     Capillary Refill: Capillary refill takes less than 2 seconds.  Neurological:     Mental Status: She is alert.  Mental status is at baseline.  Psychiatric:        Mood and Affect: Mood normal.        Behavior: Behavior normal.          Assessment & Plan:   Problem List Items Addressed This Visit       Other   Dizziness - Primary    Postural tachycardia. Reviewed prior ECHO with some valve disorder and dilated chambers of heart. Advised continue good hydration. Check for anemia today. Slow transitions - including moving legs and salt as needed. Discussed briefly POTS as well as exercise as tolerated - at this point she has not been diagnosed but it could be a possibility and if walking becomes difficult advised looking up POTS exercises. Agree with cardiology referral - has one pending.       Relevant Orders   Comprehensive metabolic panel   CBC   Milk allergy    Concerning for anaphylaxis - hives + stomach cramping. Epi pen prescribed - discussed ER if she uses it. Cont prn zytrec and pepcid and avoiding. Agree  with allergy referral - already placed. Discussed could be covid vs postpartum related. In setting of dizziness mast cell disorder is a possibility. Will defer additional work-up to allergy doctor. Discussed reading labs if new symptoms occurring due to milk protein components - unclear if this is her allergy but will monitor for this.      Relevant Medications   EPINEPHrine 0.3 mg/0.3 mL IJ SOAJ injection   Egg allergy   Relevant Medications   EPINEPHrine 0.3 mg/0.3 mL IJ SOAJ injection   Other Visit Diagnoses     Anemia, unspecified type       Relevant Orders   Comprehensive metabolic panel   CBC      I spent >25 minutes with pt , obtaining history, examining, reviewing chart, documenting encounter and discussing the above plan of care.   Return if symptoms worsen or fail to improve.  Lesleigh Noe, MD  This visit occurred during the SARS-CoV-2 public health emergency.  Safety protocols were in place, including screening questions prior to the visit, additional usage of staff PPE, and extensive cleaning of exam room while observing appropriate contact time as indicated for disinfecting solutions.

## 2020-12-20 ENCOUNTER — Encounter: Payer: Self-pay | Admitting: *Deleted

## 2021-01-06 ENCOUNTER — Ambulatory Visit (INDEPENDENT_AMBULATORY_CARE_PROVIDER_SITE_OTHER): Payer: 59 | Admitting: Internal Medicine

## 2021-01-06 ENCOUNTER — Other Ambulatory Visit: Payer: Self-pay

## 2021-01-06 ENCOUNTER — Encounter: Payer: Self-pay | Admitting: Internal Medicine

## 2021-01-06 VITALS — BP 112/76 | HR 93 | Temp 98.7°F | Ht 67.0 in | Wt 133.6 lb

## 2021-01-06 DIAGNOSIS — T7840XA Allergy, unspecified, initial encounter: Secondary | ICD-10-CM | POA: Diagnosis not present

## 2021-01-06 DIAGNOSIS — Z91018 Allergy to other foods: Secondary | ICD-10-CM | POA: Diagnosis not present

## 2021-01-06 DIAGNOSIS — T7840XD Allergy, unspecified, subsequent encounter: Secondary | ICD-10-CM

## 2021-01-06 DIAGNOSIS — D4709 Other mast cell neoplasms of uncertain behavior: Secondary | ICD-10-CM | POA: Diagnosis not present

## 2021-01-06 DIAGNOSIS — T783XXD Angioneurotic edema, subsequent encounter: Secondary | ICD-10-CM

## 2021-01-06 DIAGNOSIS — R0602 Shortness of breath: Secondary | ICD-10-CM

## 2021-01-06 DIAGNOSIS — L509 Urticaria, unspecified: Secondary | ICD-10-CM | POA: Diagnosis not present

## 2021-01-06 DIAGNOSIS — R002 Palpitations: Secondary | ICD-10-CM

## 2021-01-06 DIAGNOSIS — R1084 Generalized abdominal pain: Secondary | ICD-10-CM

## 2021-01-06 DIAGNOSIS — D8940 Mast cell activation, unspecified: Secondary | ICD-10-CM

## 2021-01-06 NOTE — Progress Notes (Signed)
Patient states that she has been having issues with new possible food sensitivities. States she has been having stomach discomfort, diarrhea, hives, slight throat swelling, chest discomfort accompanied with SOB. States this mainly happens at night.   Patient states she has seen an allergist that completed scratch and blood allergy testing. States the allergist thinks she could be having mass cell related issues.   Patient had to have emergency c-section done to deliver her baby 3 months ago. States her body rejected the amniotic fluid that entered her system. She was diagnosed with a amniotic-fluid embolism.

## 2021-01-06 NOTE — Progress Notes (Signed)
Chief Complaint  Patient presents with   Follow-up   Allergic Reaction   F/u  1. Allergies pt seeing Dr. Donneta Romberg and has dairy, egg (stomach cramps), milk, peanut butter allergies which is new and had new hives and stomach cramping and chest tightness/palpitations at times with certain foods or for now reason and hard to swallow/throat closing and lip swelling. 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 Thursday prior to visit she had cortisone shot and prednisone which temporarily help symtoms then they come back she is on pepcid 40 mg bid, xyzal 5 mg qhs, atarax 25 mg prn. She has prn epi pen  Currently she does not have IUD for contraception until further w/u done    Review of Systems  Constitutional:  Negative for weight loss.  HENT:  Negative for hearing loss.   Eyes:  Negative for blurred vision.  Respiratory:  Negative for shortness of breath.   Cardiovascular:  Negative for chest pain.  Gastrointestinal:  Negative for abdominal pain and blood in stool.  Genitourinary:  Negative for dysuria.  Musculoskeletal:  Negative for falls and joint pain.  Skin:  Negative for rash.  Neurological:  Negative for headaches.  Endo/Heme/Allergies:        +food allergies   Psychiatric/Behavioral:  Negative for depression.   Past Medical History:  Diagnosis Date   Abrasion of wrist, right    right wrist sting wray laceration right wrist   Allergy    Chicken pox    COVID-19    10/2020, 11/2020   GERD (gastroesophageal reflux disease)    Pneumonia 06/2017   Tick bite    Past Surgical History:  Procedure Laterality Date   CESAREAN SECTION N/A 09/23/2020   Procedure: CESAREAN SECTION;  Surgeon: Tyson Dense, MD;  Location: [facility];  Service: Obstetrics;  Laterality: N/A;   WISDOM TOOTH EXTRACTION     Family History  Problem Relation Age of Onset   Hypertension Mother    Arthritis Maternal Grandmother     Alcohol abuse Maternal Grandfather    Diabetes Maternal Grandfather    Cancer Paternal Grandmother        breast    Cancer Paternal Grandfather        prostate    Miscarriages / [place] Sister    Social History   Socioeconomic History   Marital status: Married    Spouse name: [name]   Number of children: Not on file   Years of education: Not on file   Highest education level: Not on file  Occupational History   Not on file  Tobacco Use   Smoking status: Never   Smokeless tobacco: Never  Vaping Use   Vaping Use: Never used  Substance and Sexual Activity   Alcohol use: Not Currently   Drug use: Never    Comment: husband    Sexual activity: Yes    Partners: Male    Birth control/protection: None  Other Topics Concern   Not on file  Social History Narrative   Stay at home mom   From [place]    Married 2 sons    Bachelors degree    Used to play volleyballl, Basketball, track    Owns guns, wears seat belt, safe in relationship    Social Determinants of Radio broadcast assistant Strain: Not on file  Food Insecurity: Not on file  Transportation Needs: Not on file  Physical Activity: Not on file  Stress: Not on file  Social Connections: Not on file  Intimate Partner Violence: Not on file   Current Meds  Medication Sig   EPINEPHrine 0.3 mg/0.3 mL IJ SOAJ injection Inject 0.3 mg into the muscle as needed for anaphylaxis.   famotidine (PEPCID) 40 MG tablet Take 40 mg by mouth 2 (two) times daily.   levocetirizine (XYZAL) 5 MG tablet Take 5 mg by mouth at bedtime.   Prenatal Vit-Fe Fumarate-FA (PRENATAL MULTIVITAMIN) TABS tablet Take 1 tablet by mouth daily at 12 noon.   Allergies  Allergen Reactions   Eggs Or Egg-Derived Products Hives   Milk-Related Compounds Anaphylaxis    Stomach upset + hives   Tetracycline Hives    Sob   Peanut Butter Flavor    Recent Results (from the past 2160 hour(s))  Comprehensive metabolic panel     Status: None   Collection  Time: 12/06/20 12:42 PM  Result Value Ref Range   Sodium 140 135 - 145 mEq/L   Potassium 3.9 3.5 - 5.1 mEq/L   Chloride 105 96 - 112 mEq/L   CO2 25 19 - 32 mEq/L   Glucose, Bld 80 70 - 99 mg/dL   BUN 14 6 - 23 mg/dL   Creatinine, Ser 0.72 0.40 - 1.20 mg/dL   Total Bilirubin 0.3 0.2 - 1.2 mg/dL   Alkaline Phosphatase 54 39 - 117 U/L   AST 23 0 - 37 U/L   ALT 24 0 - 35 U/L   Total Protein 6.6 6.0 - 8.3 g/dL   Albumin 4.4 3.5 - 5.2 g/dL   GFR 111.20 >60.00 mL/min    Comment: Calculated using the CKD-EPI Creatinine Equation (2021)   Calcium 9.2 8.4 - 10.5 mg/dL  CBC     Status: None   Collection Time: 12/06/20 12:42 PM  Result Value Ref Range   WBC 4.3 4.0 - 10.5 K/uL   RBC 4.16 3.87 - 5.11 Mil/uL   Platelets 212.0 150.0 - 400.0 K/uL   Hemoglobin 12.7 12.0 - 15.0 g/dL   HCT 37.8 36.0 - 46.0 %   MCV 90.7 78.0 - 100.0 fl   MCHC 33.7 30.0 - 36.0 g/dL   RDW 12.0 11.5 - 15.5 %   Objective  Body mass index is 20.92 kg/m. Wt Readings from Last 3 Encounters:  01/06/21 133 lb 9.6 oz (60.6 kg)  12/06/20 141 lb 8 oz (64.2 kg)  09/23/20 174 lb 14.4 oz (79.3 kg)   Temp Readings from Last 3 Encounters:  01/06/21 98.7 F (37.1 C) (Oral)  12/06/20 (!) 97 F (36.1 C) (Temporal)  09/26/20 98 F (36.7 C) (Oral)   BP Readings from Last 3 Encounters:  01/06/21 112/76  12/06/20 120/80  09/26/20 123/80   Pulse Readings from Last 3 Encounters:  01/06/21 93  12/06/20 84  09/26/20 75    Physical Exam Vitals and nursing note reviewed.  Constitutional:      Appearance: Normal appearance. She is well-developed and well-groomed.  HENT:     Head: Normocephalic and atraumatic.  Eyes:     Conjunctiva/sclera: Conjunctivae normal.     Pupils: Pupils are equal, round, and reactive to light.  Cardiovascular:     Rate and Rhythm: Normal rate and regular rhythm.     Heart sounds: Normal heart sounds. No murmur heard. Pulmonary:     Effort: Pulmonary effort is normal.     Breath sounds:  Normal breath sounds.  Abdominal:     General:  Abdomen is flat. Bowel sounds are normal.     Tenderness: There is no abdominal tenderness.  Musculoskeletal:        General: No tenderness.  Skin:    General: Skin is warm and dry.  Neurological:     General: No focal deficit present.     Mental Status: She is alert and oriented to person, place, and time. Mental status is at baseline.     Cranial Nerves: Cranial nerves 2-12 are intact.     Gait: Gait is intact.  Psychiatric:        Attention and Perception: Attention and perception normal.        Mood and Affect: Mood and affect normal.        Speech: Speech normal.        Behavior: Behavior normal. Behavior is cooperative.        Thought Content: Thought content normal.        Cognition and Memory: Cognition and memory normal.        Judgment: Judgment normal.    Assessment  Plan  Allergy, with multiple food allergies/hives/rejection of amniotic fluid with pregnancy 3 months ago - Plan: Ambulatory referral to Allergy/immunology Dr. Darrick Huntsman further w/u and r/o mast cell activation d/o/syndrome  Pepvic 40 mg bid, xyzal 5 mg qhs  atarax 25 mg prn and has epipen    Generalized abdominal pain cramping with allergic sx's and angioedema/palpitations/chest tightness and sob - Plan: Ambulatory referral to Allergy, Ambulatory referral to Immunology  HM Flu shot will wait  Tdap 06/28/20  Had gardisil 1/3 shots  Had hep B, MMR Had meningo vaccine 12/30/03  check hep B and MMR titer Pap had 04/13/16 neg pap HPV not tested Dr. Mikey Kirschner  As of 01/06/21 no IUD  Get records [facility] prior PCP Get records OB/GYN Dr. Patrick North pap given name of Dr. Georgianne Fick today LMP 07/16/17  -obtained records Dr. Christoper [facility] reviewed and scanned  Consider check lipid, tsh, vitamin D in future not fasting today   Provider: Dr. Olivia Mackie [name]-Internal Medicine

## 2021-01-06 NOTE — Patient Instructions (Addendum)
Alison Harrison   [address] [place], [place] [address]   [phone]   Alison Harrison, [place]   [address]   [place], [place] [address]   [phone]   [phone] (Fax)    Check on date of pap smear smear   Consider after breast feeding  Montelukast Tablets What is this medication? MONTELUKAST (mon te LOO kast) prevents and treats the symptoms of asthma and allergies. It works by decreasing inflammation in the airways, making it easier to breathe. Do not use this medication to treat a sudden asthma attack. This medicine may be used for other purposes; ask your health care provider or pharmacist if you have questions. COMMON BRAND NAME(S): Singulair What should I tell my care team before I take this medication? They need to know if you have any of these conditions: Liver disease An unusual or allergic reaction to montelukast, other medications, foods, dyes, or preservatives Pregnant or trying to get pregnant Breast-feeding How should I use this medication? Take this medication by mouth with water. Take it as directed on the prescription label at the same time every day. You can take this medication with or without food. If it upsets your stomach, take it with food. Keep taking it unless your care team tells you to stop. A special MedGuide will be given to you by the pharmacist with each prescription and refill. Be sure to read this information carefully each time. Talk to your care team about the use of this medication in children. While this medication may be prescribed for children as young as 15 years for selected conditions, precautions do apply. Overdosage: If you think you have taken too much of this medicine contact a poison control center or emergency room at once. NOTE: This medicine is only for you. Do not share this medicine with others. What if I miss a dose? If you miss a dose, skip it. Take your next dose at  the normal time. Do not take extra or 2 doses at the same time to make up for the missed dose. What may interact with this medication? Medications for seizures like phenytoin, phenobarbital, and carbamazepine Rifabutin Rifampin This list may not describe all possible interactions. Give your health care provider a list of all the medicines, herbs, non-prescription drugs, or dietary supplements you use. Also tell them if you smoke, drink alcohol, or use illegal drugs. Some items may interact with your medicine. What should I watch for while using this medication? Visit your health care provider for regular checks on your progress. Tell your health care provider if your allergy or asthma symptoms do not improve. Take your medication even when you do not have symptoms. If you have asthma, talk to your health care provider about what to do in an acute asthma attack. Always have your rescue medication for asthma attacks with you. Patients and their families should watch for new or worsening thoughts of suicide or depression. Also watch for sudden changes in feelings such as feeling anxious, agitated, panicky, irritable, hostile, aggressive, impulsive, severely restless, overly excited and hyperactive, or not being able to sleep. Any worsening of mood or thoughts of suicide or dying should be reported to your health care provider right away. What side effects may I notice from receiving this medication? Side effects that you should report to your care team as soon as possible: Allergic reactions-skin rash, itching, hives, swelling of the face, lips,  tongue, or throat Flu-like symptoms-fever, chills, muscle pain, cough, headache, fatigue Mood and behavior changes such as anxiety, nervousness, confusion, hallucinations, irritability, hostility, thoughts of suicide or self-harm, worsening mood, feelings of depression Pain, tingling, or numbness in the hands or feet Sinus pain or pressure around the face or  forehead Trouble sleeping Vivid dreams or nightmares Side effects that usually do not require medical attention (report to your care team if they continue or are bothersome): Cough Diarrhea Headache Runny or stuffy nose Sore throat Stomach pain This list may not describe all possible side effects. Call your doctor for medical advice about side effects. You may report side effects to FDA at 1-800-FDA-1088. Where should I keep my medication? Keep out of the reach of children and pets. Store at room temperature between 15 and 30 degrees C (59 and 86 degrees F). Protect from light and moisture. Protect from light and moisture. Keep the container tightly closed. Get rid of any unused medication after the expiration date. To get rid of medications that are no longer needed or expired: Take the medication to a medication take-back program. Check with your pharmacy or law enforcement to find a location. If you cannot return the medication, check the label or package insert to see if the medication should be thrown out in the garbage or flushed down the toilet. If you are not sure, ask your care team. If it is safe to put in the trash, empty the medication out of the container. Mix the medication with cat litter, dirt, coffee grounds, or other unwanted substance. Seal the mixture in a bag or container. Put it in the trash. NOTE: This sheet is a summary. It may not cover all possible information. If you have questions about this medicine, talk to your doctor, pharmacist, or health care provider.  2022 Elsevier/Gold Standard (2020-03-19 11:01:47)

## 2021-01-10 ENCOUNTER — Encounter: Payer: Self-pay | Admitting: Internal Medicine

## 2021-01-10 DIAGNOSIS — T783XXA Angioneurotic edema, initial encounter: Secondary | ICD-10-CM | POA: Insufficient documentation

## 2021-01-10 DIAGNOSIS — L509 Urticaria, unspecified: Secondary | ICD-10-CM | POA: Insufficient documentation

## 2021-01-10 DIAGNOSIS — Z91018 Allergy to other foods: Secondary | ICD-10-CM | POA: Insufficient documentation

## 2021-01-10 HISTORY — DX: Allergy to other foods: Z91.018

## 2021-01-10 HISTORY — DX: Urticaria, unspecified: L50.9

## 2021-01-12 ENCOUNTER — Encounter: Payer: Self-pay | Admitting: Adult Health

## 2021-01-12 ENCOUNTER — Telehealth (INDEPENDENT_AMBULATORY_CARE_PROVIDER_SITE_OTHER): Payer: 59 | Admitting: Adult Health

## 2021-01-12 ENCOUNTER — Telehealth: Payer: Self-pay

## 2021-01-12 VITALS — Ht 67.01 in | Wt 135.0 lb

## 2021-01-12 DIAGNOSIS — J069 Acute upper respiratory infection, unspecified: Secondary | ICD-10-CM | POA: Diagnosis not present

## 2021-01-12 HISTORY — DX: Acute upper respiratory infection, unspecified: J06.9

## 2021-01-12 NOTE — Patient Instructions (Signed)

## 2021-01-12 NOTE — Telephone Encounter (Signed)
Placed call to pt to start virtual visit. LMTCB

## 2021-01-12 NOTE — Progress Notes (Addendum)
Virtual Visit via Telephone Note  I connected with Alison Harrison on 01/12/21 at  8:30 AM EST by telephone and verified that I am speaking with the correct person using two identifiers.   I discussed the limitations, risks, security and privacy concerns of performing an evaluation and management service by telephone and the availability of in person appointments. I also discussed with the patient that there may be a patient responsible charge related to this service. The patient expressed understanding and agreed to proceed.   History of Present Illness: Patient reports she had chest congestion with light green colored mucous. Onset 01/07/21 . She is breastfeeding. She feels she has less congestion today since she tried mucinex last night. She reports mild wheezing but not bad enough to use inhaler she has at home. Denies any wheezing currently.  Denies any distress. Denies any sore throat, swelling or angioedema.  Patient  denies any fever, body aches,chills, rash, chest pain, shortness of breath, nausea, vomiting, or diarrhea.    Lactating.  Denies any pregnancy.  History of amniotic embolus and reaction at delivery 3 months ago. Doing well from this she reports, denies any new concerns and has appointment with specialist after seeing [name], [name], MD for this. Note reviewed.  Observations/Objective: Patient had no video capabilities. Telephone only.  Today's Vitals   01/12/21 0830  Weight: 135 lb (61.2 kg)  Height: 5' 7.01" (1.702 m)  PainSc: 0-No pain   Body mass index is 21.14 kg/m. No vital signs.   Patient is alert and oriented and responsive to questions Engages in conversation with provider. Speaks in full sentences without any pauses without any shortness of breath or distress.   Assessment and Plan:  Viral upper respiratory tract infection  Recommend covid/ flu testing. Continue over the counter plain mucinex for congestion.  She declines testing  today as she is watching 2 children under the age of two.  Be seen in person if symptoms worsening at anytime was discussed. She can call to do flu/covid/ RSV test on 01/13/21.   Follow Up Instructions:  Return in about 3 days (around 01/15/2021), or if symptoms worsen or fail to improve, for at any time for any worsening symptoms, Go to Emergency room/ urgent care if worse.    I discussed the assessment and treatment plan with the patient. The patient was provided an opportunity to ask questions and all were answered. The patient agreed with the plan and demonstrated an understanding of the instructions.   The patient was advised to call back or seek an in-person evaluation if the symptoms worsen or if the condition fails to improve as anticipated.  I provided 21 minutes of non-face-to-face time during this encounter.   Marcille Buffy, FNP

## 2021-01-13 ENCOUNTER — Encounter: Payer: Self-pay | Admitting: Internal Medicine

## 2021-01-17 ENCOUNTER — Encounter: Payer: Self-pay | Admitting: Internal Medicine

## 2021-03-23 DIAGNOSIS — L508 Other urticaria: Secondary | ICD-10-CM | POA: Diagnosis not present

## 2021-03-26 NOTE — Progress Notes (Signed)
Cardio-Obstetrics Clinic  New Evaluation  Date:  04/01/2021   ID:  Alison Harrison, DOB 09-Aug-1990, MRN [id number]  PCP:  [name], [name], MD   [facility] Providers Cardiologist:  None  Electrophysiologist:  None       Referring MD: Tyson Dense, *   Chief Complaint: TR  History of Present Illness:    Alison Harrison is a 31 y.o. female [[id number]] who is being seen today for the evaluation of tricuspid regurgitation at the request of Tyson Dense, *.   The patient states she was overall has been doing well. Her baby boy is now 68 months old and is doing well.  During her pregnancy, the patient was induced at [redacted]w[redacted]d. During induction, the patient developed hypotension and profound bradycardia. She was given multiple doses of phenylephrine without improvement. She then began to desat to the 80s and was placed on NRB. She was taken emergently to CT to ensure no acute PE which was negative. She then was taken for emergent c-section. Patient was intubated for the procedure. She did very well during the c-section and was successfully extubated. Hemodynamics improved and the patient was monitored in the ICU overnight. Symptoms thought to be due to amniotic fluid embolism. TTE was also performed on 09/2020 where LVEF 55-60%, normal RV, mild LAE, moderate RAE, mild MR, mild-to-mod TR. Was seen by Cardiology who recommended repeat TTE as out-patient.  Since that time, she has been having a lot of new food allergies and environmental triggers. 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 Other than this, she is active and able to exercise with no anginal or HF symptoms. Had some lightheadedness and rapid heart rates postpartum but this has since resolved.    Prior CV Studies Reviewed: The following studies were reviewed today: TTE 10-23-20: IMPRESSIONS   1. Left ventricular ejection fraction,  by estimation, is 55 to 60%. The  left ventricle has normal function. The left ventricle has no regional  wall motion abnormalities. Left ventricular diastolic parameters were  normal.   2. Right ventricular systolic function is normal. The right ventricular  size is normal.   3. Left atrial size was mildly dilated.   4. Right atrial size was moderately dilated.   5. The mitral valve is normal in structure. Mild mitral valve  regurgitation.   6. Tricuspid valve regurgitation is mild to moderate.   7. The aortic valve is tricuspid. Aortic valve regurgitation is not  visualized.   8. The inferior vena cava is dilated in size with <50% respiratory  variability, suggesting right atrial pressure of 15 mmHg.   Past Medical History:  Diagnosis Date   Abrasion of wrist, right    right wrist sting wray laceration right wrist   Allergy    Chicken pox    COVID-19    10/2020, 11/2020   GERD (gastroesophageal reflux disease)    Pneumonia 06/2017   Tick bite    Tricuspid regurgitation     Past Surgical History:  Procedure Laterality Date   CESAREAN SECTION N/A 09/23/2020   Procedure: CESAREAN SECTION;  Surgeon: Tyson Dense, MD;  Location: [facility];  Service: Obstetrics;  Laterality: N/A;   WISDOM TOOTH EXTRACTION        OB History     Gravida  4   Para  2   Term  2   Preterm      AB  2  Living  2      SAB  2   IAB      Ectopic      Multiple  0   Live Births  2               Current Medications: Current Meds  Medication Sig   albuterol (VENTOLIN HFA) 108 (90 Base) MCG/ACT inhaler Inhale 2 puffs into the lungs as needed.   EPINEPHrine 0.3 mg/0.3 mL IJ SOAJ injection Inject 0.3 mg into the muscle as needed for anaphylaxis.   famotidine (PEPCID) 40 MG tablet Take 40 mg by mouth 2 (two) times daily.   levocetirizine (XYZAL) 5 MG tablet Take 5 mg by mouth at bedtime.   Prenatal Vit-Fe Fumarate-FA (PRENATAL MULTIVITAMIN) TABS tablet Take 1 tablet by  mouth daily at 12 noon.     Allergies:   Eggs or egg-derived products, Milk-related compounds, Tetracycline, and Peanut butter flavor   Social History   Socioeconomic History   Marital status: Married    Spouse name: [name]   Number of children: Not on file   Years of education: Not on file   Highest education level: Not on file  Occupational History   Not on file  Tobacco Use   Smoking status: Never   Smokeless tobacco: Never  Vaping Use   Vaping Use: Never used  Substance and Sexual Activity   Alcohol use: Not Currently   Drug use: Never    Comment: husband    Sexual activity: Yes    Partners: Male    Birth control/protection: None  Other Topics Concern   Not on file  Social History Narrative   Stay at home mom   From [place]    Married 2 sons    Bachelors degree    Used to play volleyballl, Basketball, track    Owns guns, wears seat belt, safe in relationship    Social Determinants of Radio broadcast assistant Strain: Not on file  Food Insecurity: Not on file  Transportation Needs: Not on file  Physical Activity: Not on file  Stress: Not on file  Social Connections: Not on file      Family History  Problem Relation Age of Onset   Hypertension Mother    Arthritis Maternal Grandmother    Alcohol abuse Maternal Grandfather    Diabetes Maternal Grandfather    Cancer Paternal Grandmother        breast    Cancer Paternal Grandfather        prostate    Miscarriages / Stillbirths Sister       ROS:   Please see the history of present illness.     All other systems reviewed and are negative.   Labs/EKG Reviewed:    EKG:   EKG is  ordered today.  The ekg ordered today demonstrates NSR, HR 62  Recent Labs: 12/06/2020: ALT 24; BUN 14; Creatinine, Ser 0.72; Hemoglobin 12.7; Platelets 212.0; Potassium 3.9; Sodium 140   Recent Lipid Panel No results found for: CHOL, TRIG, HDL, CHOLHDL, LDLCALC, LDLDIRECT  Physical Exam:    VS:  BP 118/76    Pulse 62     Ht 5\' 7"  (1.702 m)    Wt 126 lb 9.6 oz (57.4 kg)    SpO2 98%    Breastfeeding Yes    BMI 19.83 kg/m     Wt Readings from Last 3 Encounters:  04/01/21 126 lb 9.6 oz (57.4 kg)  01/12/21 135 lb (61.2 kg)  01/06/21 133 lb 9.6 oz (60.6 kg)     GEN:  Well nourished, well developed in no acute distress HEENT: Normal NECK: No JVD; No carotid bruits CARDIAC: RRR, no murmurs, rubs, gallops RESPIRATORY:  Clear to auscultation without rales, wheezing or rhonchi  ABDOMEN: Soft, non-tender, non-distended MUSCULOSKELETAL:  No edema; No deformity  SKIN: Warm and dry NEUROLOGIC:  Alert and oriented x 3 PSYCHIATRIC:  Normal affect    Risk Assessment/Risk Calculators:   {    ASSESSMENT & PLAN:    #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 It showed normal BiV function with mild-to-moderate TR and mild MR, mild LAE, moderate RAE. Suspect degree of TR secondary to fluid resuscitation following during labor/emergent c-section. RAE/LAE common in pregnancy with increased blood volume and CO. Fortunately, the patient is active with no HF or anginal symptoms. Will repeat TTE for monitoring. -Check TTE for monitoring  #Suspected Amniotic Fluid Embolism in Pregnancy: Patient with profound hypotension and bradycardia during induction requiring pressor support, intubation and emergent c-section. CTA negative for PE and TTE with normal BiV function with no shunt or significant valve disease. Did well post-partum under the excellent care of Dr. Royston Sinner.  -Follow-up with Dr. Royston Sinner as scheduled  Patient Instructions  Medication Instructions:   Your physician recommends that you continue on your current medications as directed. Please refer to the Current Medication list given to you today.   *If you need a refill on your cardiac medications before your next appointment, please  call your pharmacy*   Testing/Procedures:  Your physician has requested that you have an echocardiogram. Echocardiography is a painless test that uses sound waves to create images of your heart. It provides your doctor with information about the size and shape of your heart and how well your hearts chambers and valves are working. This procedure takes approximately one hour. There are no restrictions for this procedure. Cardiac-OB patient: to be performed by [facility] or [facility].    Follow-Up: At [facility], you and your health needs are our priority.  As part of our continuing mission to provide you with exceptional heart care, we have created designated Provider Care Teams.  These Care Teams include your primary Cardiologist (physician) and Advanced Practice Providers (APPs -  Physician Assistants and Nurse Practitioners) who all work together to provide you with the care you need, when you need it.  We recommend signing up for the patient portal called "MyChart".  Sign up information is provided on this After Visit Summary.  MyChart is used to connect with patients for Virtual Visits (Telemedicine).  Patients are able to view lab/test results, encounter notes, upcoming appointments, etc.  Non-urgent messages can be sent to your provider as well.   To learn more about what you can do with MyChart, go to [email or web address].    Your next appointment:   1 year(s)  The format for your next appointment:   In Person  Provider:   DR. Johney Frame AT [facility]:  No follow-ups on file.   Medication Adjustments/Labs and Tests Ordered: Current medicines are reviewed at length with the patient today.  Concerns regarding medicines are outlined above.  Tests Ordered: Orders Placed This Encounter  Procedures   EKG 12-Lead   ECHOCARDIOGRAM COMPLETE   Medication Changes: No orders of the defined types were placed in this encounter.

## 2021-04-01 ENCOUNTER — Ambulatory Visit (INDEPENDENT_AMBULATORY_CARE_PROVIDER_SITE_OTHER): Payer: 59 | Admitting: Cardiology

## 2021-04-01 ENCOUNTER — Encounter: Payer: Self-pay | Admitting: Cardiology

## 2021-04-01 ENCOUNTER — Other Ambulatory Visit: Payer: Self-pay

## 2021-04-01 VITALS — BP 118/76 | HR 62 | Ht 67.0 in | Wt 126.6 lb

## 2021-04-01 DIAGNOSIS — O88113 Amniotic fluid embolism in pregnancy, third trimester: Secondary | ICD-10-CM | POA: Diagnosis not present

## 2021-04-01 DIAGNOSIS — I340 Nonrheumatic mitral (valve) insufficiency: Secondary | ICD-10-CM | POA: Diagnosis not present

## 2021-04-01 DIAGNOSIS — I071 Rheumatic tricuspid insufficiency: Secondary | ICD-10-CM

## 2021-04-01 NOTE — Patient Instructions (Signed)
Medication Instructions:   Your physician recommends that you continue on your current medications as directed. Please refer to the Current Medication list given to you today.   *If you need a refill on your cardiac medications before your next appointment, please call your pharmacy*   Testing/Procedures:  Your physician has requested that you have an echocardiogram. Echocardiography is a painless test that uses sound waves to create images of your heart. It provides your doctor with information about the size and shape of your heart and how well your hearts chambers and valves are working. This procedure takes approximately one hour. There are no restrictions for this procedure. Cardiac-OB patient: to be performed by [facility] or [facility].    Follow-Up: At [facility], you and your health needs are our priority.  As part of our continuing mission to provide you with exceptional heart care, we have created designated Provider Care Teams.  These Care Teams include your primary Cardiologist (physician) and Advanced Practice Providers (APPs -  Physician Assistants and Nurse Practitioners) who all work together to provide you with the care you need, when you need it.  We recommend signing up for the patient portal called "MyChart".  Sign up information is provided on this After Visit Summary.  MyChart is used to connect with patients for Virtual Visits (Telemedicine).  Patients are able to view lab/test results, encounter notes, upcoming appointments, etc.  Non-urgent messages can be sent to your provider as well.   To learn more about what you can do with MyChart, go to [email or web address].    Your next appointment:   1 year(s)  The format for your next appointment:   In Person  Provider:   DR. Johney Frame AT [facility]

## 2021-04-05 DIAGNOSIS — L508 Other urticaria: Secondary | ICD-10-CM | POA: Diagnosis not present

## 2021-04-14 ENCOUNTER — Ambulatory Visit (HOSPITAL_COMMUNITY): Payer: 59 | Attending: Cardiology

## 2021-04-14 ENCOUNTER — Other Ambulatory Visit: Payer: Self-pay

## 2021-04-14 DIAGNOSIS — I071 Rheumatic tricuspid insufficiency: Secondary | ICD-10-CM

## 2021-04-14 LAB — ECHOCARDIOGRAM COMPLETE
Area-P 1/2: 3.65 cm2
S' Lateral: 3 cm

## 2021-04-22 ENCOUNTER — Telehealth: Payer: Self-pay | Admitting: Internal Medicine

## 2021-04-22 NOTE — Telephone Encounter (Signed)
Pt called in stating she is having some bad stomach cramps and diarrhea. Pt stated that she doesn't know if the symptoms is coming from her allergies are not. Pt requesting callback

## 2021-04-22 NOTE — Telephone Encounter (Signed)
I agree with the need to be seen today at urgent care.  Please follow-up with her to make sure she was evaluated.  Thanks.

## 2021-04-22 NOTE — Telephone Encounter (Signed)
Called Patient back and informed her of recommendations. She states that she will go be seen

## 2021-04-22 NOTE — Telephone Encounter (Signed)
Advised Patient that with the frequency of the diarrhea and slight color change she should be evaluated by urgent care or ED. Moved Patient appointment up to 04/26/21 with Dr Olivia Mackie [name]. Patient informed that this is a 15 minute acute visit. Patient verbalized understanding   Informed Patient that she should still be evaluated by emergency medicine. Patient states she will be seen at the urgent care but wants to keep her acute visit with Dr Olivia Mackie [name] as well.   For your information doctor of the day

## 2021-04-22 NOTE — Telephone Encounter (Signed)
Patient stated during earlier phone call that she would go to be seen at [facility] clinic urgent care

## 2021-04-22 NOTE — Telephone Encounter (Signed)
Onset of Tuesday afternoon. Stomach cramps rated 8/10 and very painful but were off and on. Continued for the past several days with upset stomach. This morning she has been having pretty bad diarrhea every 2 minutes. Stool is now completely liquid and light yellowish-brown. No red or black in her stool. Patient also had some chest tightness like when she had an allergic reaction. Has two small kids and husband, no one else is sick.   Patient has been following a low histamine diet. States she branched out and ate foods off that list but nothing she has never eaten before.   Patient states she has been having weird immune issues in the past year and did not know if this was related.

## 2021-04-22 NOTE — Telephone Encounter (Signed)
AGREE was she seen at ED/urgent care?

## 2021-04-26 ENCOUNTER — Ambulatory Visit: Payer: 59 | Admitting: Internal Medicine

## 2021-04-28 DIAGNOSIS — R69 Illness, unspecified: Secondary | ICD-10-CM | POA: Diagnosis not present

## 2021-04-28 DIAGNOSIS — F4322 Adjustment disorder with anxiety: Secondary | ICD-10-CM | POA: Diagnosis not present

## 2021-04-28 DIAGNOSIS — F4010 Social phobia, unspecified: Secondary | ICD-10-CM | POA: Diagnosis not present

## 2021-05-03 ENCOUNTER — Ambulatory Visit: Payer: 59 | Admitting: Internal Medicine

## 2021-05-18 DIAGNOSIS — Z124 Encounter for screening for malignant neoplasm of cervix: Secondary | ICD-10-CM | POA: Diagnosis not present

## 2021-05-18 DIAGNOSIS — Z309 Encounter for contraceptive management, unspecified: Secondary | ICD-10-CM | POA: Diagnosis not present

## 2021-05-18 DIAGNOSIS — Z681 Body mass index (BMI) 19 or less, adult: Secondary | ICD-10-CM | POA: Diagnosis not present

## 2021-05-18 DIAGNOSIS — R69 Illness, unspecified: Secondary | ICD-10-CM | POA: Diagnosis not present

## 2021-05-18 DIAGNOSIS — Z9889 Other specified postprocedural states: Secondary | ICD-10-CM | POA: Diagnosis not present

## 2021-05-18 DIAGNOSIS — Z01419 Encounter for gynecological examination (general) (routine) without abnormal findings: Secondary | ICD-10-CM | POA: Diagnosis not present

## 2021-05-18 DIAGNOSIS — M6208 Separation of muscle (nontraumatic), other site: Secondary | ICD-10-CM | POA: Diagnosis not present

## 2021-05-18 LAB — HM PAP SMEAR: HM Pap smear: NORMAL

## 2021-06-01 DIAGNOSIS — R21 Rash and other nonspecific skin eruption: Secondary | ICD-10-CM | POA: Diagnosis not present

## 2021-06-01 DIAGNOSIS — L508 Other urticaria: Secondary | ICD-10-CM | POA: Diagnosis not present

## 2021-06-01 DIAGNOSIS — R109 Unspecified abdominal pain: Secondary | ICD-10-CM | POA: Diagnosis not present

## 2021-06-01 DIAGNOSIS — B354 Tinea corporis: Secondary | ICD-10-CM | POA: Diagnosis not present

## 2021-06-07 DIAGNOSIS — Z30430 Encounter for insertion of intrauterine contraceptive device: Secondary | ICD-10-CM | POA: Diagnosis not present

## 2021-06-07 DIAGNOSIS — Z30431 Encounter for routine checking of intrauterine contraceptive device: Secondary | ICD-10-CM | POA: Diagnosis not present

## 2021-06-07 DIAGNOSIS — Z3202 Encounter for pregnancy test, result negative: Secondary | ICD-10-CM | POA: Diagnosis not present

## 2021-06-14 DIAGNOSIS — F4010 Social phobia, unspecified: Secondary | ICD-10-CM | POA: Diagnosis not present

## 2021-06-14 DIAGNOSIS — F4322 Adjustment disorder with anxiety: Secondary | ICD-10-CM | POA: Diagnosis not present

## 2021-06-14 DIAGNOSIS — R69 Illness, unspecified: Secondary | ICD-10-CM | POA: Diagnosis not present

## 2021-06-28 DIAGNOSIS — F4010 Social phobia, unspecified: Secondary | ICD-10-CM | POA: Diagnosis not present

## 2021-06-28 DIAGNOSIS — F4322 Adjustment disorder with anxiety: Secondary | ICD-10-CM | POA: Diagnosis not present

## 2021-06-28 DIAGNOSIS — R69 Illness, unspecified: Secondary | ICD-10-CM | POA: Diagnosis not present

## 2021-07-07 ENCOUNTER — Encounter: Payer: Self-pay | Admitting: Internal Medicine

## 2021-07-07 ENCOUNTER — Ambulatory Visit (INDEPENDENT_AMBULATORY_CARE_PROVIDER_SITE_OTHER): Payer: 59 | Admitting: Internal Medicine

## 2021-07-07 VITALS — BP 104/70 | HR 67 | Temp 99.1°F | Resp 14 | Ht 67.0 in | Wt 125.4 lb

## 2021-07-07 DIAGNOSIS — J0110 Acute frontal sinusitis, unspecified: Secondary | ICD-10-CM

## 2021-07-07 DIAGNOSIS — Z13818 Encounter for screening for other digestive system disorders: Secondary | ICD-10-CM | POA: Diagnosis not present

## 2021-07-07 DIAGNOSIS — E559 Vitamin D deficiency, unspecified: Secondary | ICD-10-CM | POA: Diagnosis not present

## 2021-07-07 DIAGNOSIS — Z1389 Encounter for screening for other disorder: Secondary | ICD-10-CM | POA: Diagnosis not present

## 2021-07-07 DIAGNOSIS — Z1329 Encounter for screening for other suspected endocrine disorder: Secondary | ICD-10-CM | POA: Diagnosis not present

## 2021-07-07 DIAGNOSIS — Z0000 Encounter for general adult medical examination without abnormal findings: Secondary | ICD-10-CM | POA: Diagnosis not present

## 2021-07-07 DIAGNOSIS — Z13220 Encounter for screening for lipoid disorders: Secondary | ICD-10-CM

## 2021-07-07 DIAGNOSIS — E611 Iron deficiency: Secondary | ICD-10-CM | POA: Diagnosis not present

## 2021-07-07 MED ORDER — AZITHROMYCIN 250 MG PO TABS
ORAL_TABLET | ORAL | 0 refills | Status: AC
Start: 2021-07-07 — End: 2021-07-12

## 2021-07-07 MED ORDER — CROMOLYN SODIUM 100 MG/5ML PO CONC: 100.0000 mg | Freq: Three times a day (TID) | ORAL | Status: DC

## 2021-07-07 NOTE — Progress Notes (Signed)
Chief Complaint  ?Patient presents with  ? Follow-up  ?  Disc about visits with [facility], sinus issues which occurred wks ago. Had IUD placed 4 wks ago (Mirena) tolerating well.   ? ?Annual  ?1. Chronic urticaria and anaphylaxis seeing Dr. Lloyd Huger on cromolyn qid with means for now as of 05/2021 will f/u  ?2. C/o sinus issues x 2 weeks has not tested for covid  ? ? ?Review of Systems  ?Constitutional:  Negative for weight loss.  ?HENT:  Negative for hearing loss.   ?Eyes:  Negative for blurred vision.  ?Respiratory:  Negative for shortness of breath.   ?Cardiovascular:  Negative for chest pain.  ?Gastrointestinal:  Negative for abdominal pain and blood in stool.  ?Genitourinary:  Negative for dysuria.  ?Musculoskeletal:  Negative for falls and joint pain.  ?Skin:  Negative for rash.  ?Neurological:  Negative for headaches.  ?Psychiatric/Behavioral:  Negative for depression.   ?Past Medical History:  ?Diagnosis Date  ? Abrasion of wrist, right   ? right wrist sting wray laceration right wrist  ? Allergy   ? Chicken pox   ? COVID-19   ? 10/2020, 11/2020  ? GERD (gastroesophageal reflux disease)   ? Pneumonia 06/2017  ? Tick bite   ? Tricuspid regurgitation   ? ?Past Surgical History:  ?Procedure Laterality Date  ? CESAREAN SECTION N/A 09/23/2020  ? Procedure: CESAREAN SECTION;  Surgeon: Tyson Dense, MD;  Location: [facility] LD ORS;  Service: Obstetrics;  Laterality: N/A;  ? WISDOM TOOTH EXTRACTION    ? ?Family History  ?Problem Relation Age of Onset  ? Hypertension Mother   ? Arthritis Maternal Grandmother   ? Alcohol abuse Maternal Grandfather   ? Diabetes Maternal Grandfather   ? Cancer Paternal Grandmother   ?     breast   ? Cancer Paternal Grandfather   ?     prostate   ? Miscarriages / Stillbirths Sister   ? ?Social History  ? ?Socioeconomic History  ? Marital status: Married  ?  Spouse name: Bonne Dolores  ? Number of children: Not on file  ? Years of education: Not on file  ? Highest education level: Not on file   ?Occupational History  ? Not on file  ?Tobacco Use  ? Smoking status: Never  ? Smokeless tobacco: Never  ?Vaping Use  ? Vaping Use: Never used  ?Substance and Sexual Activity  ? Alcohol use: Not Currently  ? Drug use: Never  ?  Comment: husband   ? Sexual activity: Yes  ?  Partners: Male  ?  Birth control/protection: None  ?Other Topics Concern  ? Not on file  ?Social History Narrative  ? Stay at home mom  ? From [place]   ? Married 2 sons   ? Bachelors degree   ? Used to play volleyballl, Basketball, track   ? Owns guns, wears seat belt, safe in relationship   ? ?Social Determinants of Health  ? ?Financial Resource Strain: Not on file  ?Food Insecurity: Not on file  ?Transportation Needs: Not on file  ?Physical Activity: Not on file  ?Stress: Not on file  ?Social Connections: Not on file  ?Intimate Partner Violence: Not on file  ? ?Current Meds  ?Medication Sig  ? albuterol (VENTOLIN HFA) 108 (90 Base) MCG/ACT inhaler Inhale 2 puffs into the lungs as needed.  ? azithromycin (ZITHROMAX) 250 MG tablet Take 2 tablets on day 1, then 1 tablet daily on days 2 through 5 with  food  ? EPINEPHrine 0.3 mg/0.3 mL IJ SOAJ injection Inject 0.3 mg into the muscle as needed for anaphylaxis.  ? famotidine (PEPCID) 40 MG tablet Take 40 mg by mouth 2 (two) times daily.  ? levocetirizine (XYZAL) 5 MG tablet Take 5 mg by mouth at bedtime.  ? Prenatal Vit-Fe Fumarate-FA (PRENATAL MULTIVITAMIN) TABS tablet Take 1 tablet by mouth daily at 12 noon.  ? [DISCONTINUED] cromolyn (GASTROCROM) 100 MG/5ML solution SMARTSIG:5 Milliliter(s) By Mouth 4 Times Daily  ? ?Allergies  ?Allergen Reactions  ? Eggs Or Egg-Derived Products Hives  ? Milk-Related Compounds Anaphylaxis  ?  Stomach upset + hives  ? Tetracycline Hives  ?  Sob  ? Peanut Butter Flavor   ? ?Recent Results (from the past 2160 hour(s))  ?ECHOCARDIOGRAM COMPLETE     Status: None  ? Collection Time: 04/14/21  9:07 AM  ?Result Value Ref Range  ? Area-P 1/2 3.65 cm2  ? S' Lateral 3.00  cm  ? ?Objective  ?Body mass index is 19.64 kg/m?. ?Wt Readings from Last 3 Encounters:  ?07/07/21 125 lb 6.4 oz (56.9 kg)  ?04/01/21 126 lb 9.6 oz (57.4 kg)  ?01/12/21 135 lb (61.2 kg)  ? ?Temp Readings from Last 3 Encounters:  ?07/07/21 99.1 ?F (37.3 ?C) (Oral)  ?01/06/21 98.7 ?F (37.1 ?C) (Oral)  ?12/06/20 (!) 97 ?F (36.1 ?C) (Temporal)  ? ?BP Readings from Last 3 Encounters:  ?07/07/21 104/70  ?04/01/21 118/76  ?01/06/21 112/76  ? ?Pulse Readings from Last 3 Encounters:  ?07/07/21 67  ?04/01/21 62  ?01/06/21 93  ? ? ?Physical Exam ?Vitals and nursing note reviewed.  ?Constitutional:   ?   Appearance: Normal appearance. She is well-developed and well-groomed.  ?HENT:  ?   Head: Normocephalic and atraumatic.  ?Eyes:  ?   Conjunctiva/sclera: Conjunctivae normal.  ?   Pupils: Pupils are equal, round, and reactive to light.  ?Cardiovascular:  ?   Rate and Rhythm: Normal rate and regular rhythm.  ?   Heart sounds: Normal heart sounds. No murmur heard. ?Pulmonary:  ?   Effort: Pulmonary effort is normal.  ?   Breath sounds: Normal breath sounds.  ?Abdominal:  ?   General: Abdomen is flat. Bowel sounds are normal.  ?   Tenderness: There is no abdominal tenderness.  ?Musculoskeletal:     ?   General: No tenderness.  ?Skin: ?   General: Skin is warm and dry.  ?Neurological:  ?   General: No focal deficit present.  ?   Mental Status: She is alert and oriented to person, place, and time. Mental status is at baseline.  ?   Cranial Nerves: Cranial nerves 2-12 are intact.  ?   Motor: Motor function is intact.  ?   Coordination: Coordination is intact.  ?   Gait: Gait is intact.  ?Psychiatric:     ?   Attention and Perception: Attention and perception normal.     ?   Mood and Affect: Mood and affect normal.     ?   Speech: Speech normal.     ?   Behavior: Behavior normal. Behavior is cooperative.     ?   Thought Content: Thought content normal.     ?   Cognition and Memory: Cognition and memory normal.     ?   Judgment:  Judgment normal.  ? ? ?Assessment  ?Plan  ?Annual physical exam - Plan: Comprehensive metabolic panel, Lipid panel, CBC with Differential/Platelet, TSH, Urinalysis, Routine w reflex  microscopic, Vitamin D (25 hydroxy) ? ?Acute non-recurrent frontal sinusitis - Plan: azithromycin (ZITHROMAX) 250 MG tablet ?Test for covid  ? ?T corporis  ?Topicals helping some but coming back let me know if wants to try diflucan weekly x 1-4 weeks ?Derm appt 07/21/21 ? ?HM ?Flu shot will wait  ?Tdap 06/28/20  ?Had gardisil 1/3 shots  ?Had hep B, MMR ?Had meningo vaccine 12/30/03  ?check hep B and MMR titer ?Pap had 04/13/16 neg pap HPV not tested Dr. Mikey Kirschner  ?As of 06/2021 + IUD ?Ob/gyn Dr. Daneil Dan leger as of 07/2021 pap had in 2023 get records  ? ?  ?Get records [place] prior PCP ?Get records OB/GYN Dr. Patrick North pap given name of Dr. Georgianne Fick today LMP 07/16/17  ?-obtained records Dr. Christoper Fabian reviewed and scanned  ? ?  ? ?07/21/21 brenda appt dermatology [address]  ?Tbse  ?Provider: Dr. Olivia Mackie [name]-[facility] Medicine  ?

## 2021-07-07 NOTE — Patient Instructions (Addendum)
Nasal saline 2 sprays then flonase  ?Test for covid  ? ? ?Let me know if you want to try Diflucan for fungal skin rash 1x per week x 2-4 weeks  ? ? ?Body Ringworm ?Body ringworm is an infection of the skin that often causes a ring-shaped rash. Body ringworm is also called tinea corporis. ?Body ringworm can affect any part of your skin. This condition is easily spread from person to person (is very contagious). ?What are the causes? ?This condition is caused by fungi called dermatophytes. The condition develops when these fungi grow out of control on the skin. ?You can get this condition if you touch a person or animal that has it. You can also get it if you share any items with an infected person or pet. These include: ?Clothing, bedding, and towels. ?Brushes or combs. ?Gym equipment. ?Any other object that has the fungus on it. ?What increases the risk? ?You are more likely to develop this condition if you: ?Play sports that involve close physical contact, such as wrestling. ?Sweat a lot. ?Live in areas that are hot and humid. ?Use public showers. ?Have a weakened immune system. ?What are the signs or symptoms? ?Symptoms of this condition include: ?Itchy, raised red spots and bumps. ?Red scaly patches. ?A ring-shaped rash. The rash may have: ?A clear center. ?Scales or red bumps at its center. ?Redness near its borders. ?Dry and scaly skin on or around it. ?How is this diagnosed? ?This condition can usually be diagnosed with a skin exam. A skin scraping may be taken from the affected area and examined under a microscope to see if the fungus is present. ?How is this treated? ?This condition may be treated with: ?An antifungal cream or ointment. ?An antifungal shampoo. ?Antifungal medicines. These may be prescribed if your ringworm: ?Is severe. ?Keeps coming back. ?Lasts a long time. ?Follow these instructions at home: ?Take over-the-counter and prescription medicines only as told by your health care provider. ?If  you were given an antifungal cream or ointment: ?Use it as told by your health care provider. ?Wash the infected area and dry it completely before applying the cream or ointment. ?If you were given an antifungal shampoo: ?Use it as told by your health care provider. ?Leave the shampoo on your body for 3-5 minutes before rinsing. ?While you have a rash: ?Wear loose clothing to stop clothes from rubbing and irritating it. ?Wash or change your bed sheets every night. ?Disinfect or throw out items that may be infected. ?Wash clothes and bed sheets in hot water. ?Wash your hands often with soap and water. If soap and water are not available, use hand sanitizer. ?If your pet has the same infection, take your pet to see a veterinarian for treatment. ?How is this prevented? ?Take a bath or shower every day and after every time you work out or play sports. ?Dry your skin completely after bathing. ?Wear sandals or shoes in public places and showers. ?Change your clothes every day. ?Wash athletic clothes after each use. ?Do not share personal items with others. ?Avoid touching red patches of skin on other people. ?Avoid touching pets that have bald spots. ?If you touch an animal that has a bald spot, wash your hands. ?Contact a health care provider if: ?Your rash continues to spread after 7 days of treatment. ?Your rash is not gone in 4 weeks. ?The area around your rash gets red, warm, tender, and swollen. ?Summary ?Body ringworm is an infection of the skin that  often causes a ring-shaped rash. ?This condition is easily spread from person to person (is very contagious). ?This condition may be treated with antifungal cream or ointment, antifungal shampoo, or antifungal medicines. ?Take over-the-counter and prescription medicines only as told by your health care provider. ?This information is not intended to replace advice given to you by your health care provider. Make sure you discuss any questions you have with your health  care provider. ?Document Revised: 12/14/2020 Document Reviewed: 12/14/2020 ?Elsevier Patient Education ? [place]. ?Allergic Rhinitis, Adult ? ?Allergic rhinitis is an allergic reaction that affects the mucous membrane inside the nose. The mucous membrane is the tissue that produces mucus. ?There are two types of allergic rhinitis: ?Seasonal. This type is also called hay fever and happens only during certain seasons. ?Perennial. This type can happen at any time of the year. ?Allergic rhinitis cannot be spread from person to person. This condition can be mild, moderate, or severe. It can develop at any age and may be outgrown. ?What are the causes? ?This condition is caused by allergens. These are things that can cause an allergic reaction. Allergens may differ for seasonal allergic rhinitis and perennial allergic rhinitis. ?Seasonal allergic rhinitis is triggered by pollen. Pollen can come from grasses, trees, and weeds. ?Perennial allergic rhinitis may be triggered by: ?Dust mites. ?Proteins in a pet's urine, saliva, or dander. Dander is dead skin cells from a pet. ?Smoke, mold, or car fumes. ?What increases the risk? ?You are more likely to develop this condition if you have a family history of allergies or other conditions related to allergies, including: ?Allergic conjunctivitis. This is inflammation of parts of the eyes and eyelids. ?Asthma. This condition affects the lungs and makes it hard to breathe. ?Atopic dermatitis or eczema. This is long term (chronic) inflammation of the skin. ?Food allergies. ?What are the signs or symptoms? ?Symptoms of this condition include: ?Sneezing or coughing. ?A stuffy nose (nasal congestion), itchy nose, or nasal discharge. ?Itchy eyes and tearing of the eyes. ?A feeling of mucus dripping down the back of your throat (postnasal drip). ?Trouble sleeping. ?Tiredness or fatigue. ?Headache. ?Sore throat. ?How is this diagnosed? ?This condition may be diagnosed with your  symptoms, medical history, and physical exam. Your health care provider may check for related conditions, such as: ?Asthma. ?Pink eye. This is eye inflammation caused by infection (conjunctivitis). ?Ear infection. ?Upper respiratory infection. This is an infection in the nose, throat, or upper airways. ?You may also have tests to find out which allergens trigger your symptoms. These may include skin tests or blood tests. ?How is this treated? ?There is no cure for this condition, but treatment can help control symptoms. Treatment may include: ?Taking medicines that block allergy symptoms, such as corticosteroids and antihistamines. Medicine may be given as a shot, nasal spray, or pill. ?Avoiding any allergens. ?Being exposed again and again to tiny amounts of allergens to help you build a defense against allergens (immunotherapy). This is done if other treatments have not helped. It may include: ?Allergy shots. These are injected medicines that have small amounts of allergen in them. ?Sublingual immunotherapy. This involves taking small doses of a medicine with allergen in it under your tongue. ?If these treatments do not work, your health care provider may prescribe newer, stronger medicines. ?Follow these instructions at home: ?Avoiding allergens ?Find out what you are allergic to and avoid those allergens. These are some things you can do to help avoid allergens: ?If you have perennial allergies: ?  Replace carpet with wood, tile, or vinyl flooring. Carpet can trap dander and dust. ?Do not smoke. Do not allow smoking in your home. ?Change your heating and air conditioning filters at least once a month. ?If you have seasonal allergies, take these steps during allergy season: ?Keep windows closed as much as possible. ?Plan outdoor activities when pollen counts are lowest. Check pollen counts before you plan outdoor activities. ?When coming indoors, change clothing and shower before sitting on furniture or  bedding. ?If you have a pet in the house that produces allergens: ?Keep the pet out of the bedroom. ?Vacuum, sweep, and dust regularly. ?General instructions ?Take over-the-counter and prescription medicines only as

## 2021-07-21 DIAGNOSIS — D2262 Melanocytic nevi of left upper limb, including shoulder: Secondary | ICD-10-CM | POA: Diagnosis not present

## 2021-07-21 DIAGNOSIS — D225 Melanocytic nevi of trunk: Secondary | ICD-10-CM | POA: Diagnosis not present

## 2021-07-21 DIAGNOSIS — D2272 Melanocytic nevi of left lower limb, including hip: Secondary | ICD-10-CM | POA: Diagnosis not present

## 2021-07-21 DIAGNOSIS — D2261 Melanocytic nevi of right upper limb, including shoulder: Secondary | ICD-10-CM | POA: Diagnosis not present

## 2021-07-21 DIAGNOSIS — R21 Rash and other nonspecific skin eruption: Secondary | ICD-10-CM | POA: Diagnosis not present

## 2021-07-21 DIAGNOSIS — D2271 Melanocytic nevi of right lower limb, including hip: Secondary | ICD-10-CM | POA: Diagnosis not present

## 2021-07-21 DIAGNOSIS — L218 Other seborrheic dermatitis: Secondary | ICD-10-CM | POA: Diagnosis not present

## 2021-07-21 DIAGNOSIS — D485 Neoplasm of uncertain behavior of skin: Secondary | ICD-10-CM | POA: Diagnosis not present

## 2021-08-04 DIAGNOSIS — F4322 Adjustment disorder with anxiety: Secondary | ICD-10-CM | POA: Diagnosis not present

## 2021-08-04 DIAGNOSIS — R69 Illness, unspecified: Secondary | ICD-10-CM | POA: Diagnosis not present

## 2021-08-04 DIAGNOSIS — F4010 Social phobia, unspecified: Secondary | ICD-10-CM | POA: Diagnosis not present

## 2021-09-29 DIAGNOSIS — F4010 Social phobia, unspecified: Secondary | ICD-10-CM | POA: Diagnosis not present

## 2021-09-29 DIAGNOSIS — R69 Illness, unspecified: Secondary | ICD-10-CM | POA: Diagnosis not present

## 2021-09-29 DIAGNOSIS — F4322 Adjustment disorder with anxiety: Secondary | ICD-10-CM | POA: Diagnosis not present

## 2021-10-05 DIAGNOSIS — D225 Melanocytic nevi of trunk: Secondary | ICD-10-CM | POA: Diagnosis not present

## 2021-10-24 DIAGNOSIS — D8940 Mast cell activation, unspecified: Secondary | ICD-10-CM | POA: Diagnosis not present

## 2021-10-24 DIAGNOSIS — L508 Other urticaria: Secondary | ICD-10-CM | POA: Diagnosis not present

## 2021-10-24 DIAGNOSIS — R0602 Shortness of breath: Secondary | ICD-10-CM | POA: Diagnosis not present

## 2021-10-24 DIAGNOSIS — B354 Tinea corporis: Secondary | ICD-10-CM | POA: Diagnosis not present

## 2021-11-02 ENCOUNTER — Encounter: Payer: Self-pay | Admitting: Internal Medicine

## 2021-11-03 ENCOUNTER — Encounter: Payer: Self-pay | Admitting: Family Medicine

## 2021-11-03 ENCOUNTER — Telehealth (INDEPENDENT_AMBULATORY_CARE_PROVIDER_SITE_OTHER): Payer: 59 | Admitting: Family Medicine

## 2021-11-03 VITALS — Ht 67.0 in | Wt 125.0 lb

## 2021-11-03 DIAGNOSIS — W57XXXA Bitten or stung by nonvenomous insect and other nonvenomous arthropods, initial encounter: Secondary | ICD-10-CM | POA: Diagnosis not present

## 2021-11-03 DIAGNOSIS — S30860A Insect bite (nonvenomous) of lower back and pelvis, initial encounter: Secondary | ICD-10-CM | POA: Diagnosis not present

## 2021-11-03 NOTE — Progress Notes (Signed)
[facility] Telemedicine Visit  Patient consented to have virtual visit and was identified by name and date of birth. Method of visit: Video  Encounter participants: Patient: Alison Harrison - located at home Provider: Carollee Leitz - located at office Others (if applicable): None  Chief Complaint: rash   HPI:  Patient reports having tic bite.  Found tic on right inner gluteal fold.  Husband pulled tic out and was able to get entire tic.  Incident occurred 2 nights ago.  Does not think was attached moe than 36 hours and has not been more than 72 hours since tic was removed.  Reports some redness at the site of removal that seems to have improved today.  She was concerned as she has noticed some marks there that looked like stretch marks but she had not known them to be there previously.  Denies any fevers, pain or worsening erythema/edema.  Unknown what species of tic.    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 has Mirena but had spotting around 10/27/21  BMI 19.58 kg/m   Respiratory: Speaking in full sentences.  No increased work of breathing.   Derm: difficult to view from picture from 11/02/21.   Appears mildly erythematous with some mild edema.      Assessment/Plan:  Tick bite Patient reports improving in erythema.  Suspect immunological reaction to insect bite causing some edema that increased the markings to become more visible.  Low suspicion for Lyme Disase given not in high endemic area and doesn't meet criteria. Do not think antibiotics are warranted at this time. Doubt anaphylaxis given now greater than 48 hours since incident occurred. Given that symptoms are improving would continue to monitor. If worsening symptoms notify MD. -Strict return precautions provided.  Patient agreeable to plan    Time spent  during visit with patient: 20 minutes

## 2021-11-03 NOTE — Telephone Encounter (Signed)
noted 

## 2021-11-13 ENCOUNTER — Encounter: Payer: Self-pay | Admitting: Family Medicine

## 2021-11-13 DIAGNOSIS — W57XXXA Bitten or stung by nonvenomous insect and other nonvenomous arthropods, initial encounter: Secondary | ICD-10-CM | POA: Insufficient documentation

## 2021-11-13 NOTE — Assessment & Plan Note (Signed)
Patient reports improving in erythema.  Suspect immunological reaction to insect bite causing some edema that increased the markings to become more visible.  Low suspicion for Lyme Disase given not in high endemic area and doesn't meet criteria. Do not think antibiotics are warranted at this time. Doubt anaphylaxis given now greater than 48 hours since incident occurred. Given that symptoms are improving would continue to monitor. If worsening symptoms notify MD. -Strict return precautions provided.  Patient agreeable to plan

## 2021-11-24 DIAGNOSIS — F4310 Post-traumatic stress disorder, unspecified: Secondary | ICD-10-CM | POA: Diagnosis not present

## 2021-11-24 DIAGNOSIS — F4010 Social phobia, unspecified: Secondary | ICD-10-CM | POA: Diagnosis not present

## 2021-11-24 DIAGNOSIS — R69 Illness, unspecified: Secondary | ICD-10-CM | POA: Diagnosis not present

## 2021-11-29 DIAGNOSIS — Z881 Allergy status to other antibiotic agents status: Secondary | ICD-10-CM | POA: Diagnosis not present

## 2021-11-29 DIAGNOSIS — D8940 Mast cell activation, unspecified: Secondary | ICD-10-CM | POA: Diagnosis not present

## 2021-11-29 DIAGNOSIS — J309 Allergic rhinitis, unspecified: Secondary | ICD-10-CM | POA: Diagnosis not present

## 2021-11-29 DIAGNOSIS — Z8249 Family history of ischemic heart disease and other diseases of the circulatory system: Secondary | ICD-10-CM | POA: Diagnosis not present

## 2021-11-29 DIAGNOSIS — K219 Gastro-esophageal reflux disease without esophagitis: Secondary | ICD-10-CM | POA: Diagnosis not present

## 2021-12-06 ENCOUNTER — Other Ambulatory Visit: Payer: 59

## 2021-12-09 ENCOUNTER — Encounter: Payer: Self-pay | Admitting: Internal Medicine

## 2021-12-09 ENCOUNTER — Ambulatory Visit (INDEPENDENT_AMBULATORY_CARE_PROVIDER_SITE_OTHER): Payer: 59 | Admitting: Internal Medicine

## 2021-12-09 VITALS — BP 116/60 | HR 67 | Temp 98.5°F | Ht 67.0 in | Wt 128.6 lb

## 2021-12-09 DIAGNOSIS — Z91011 Allergy to milk products: Secondary | ICD-10-CM

## 2021-12-09 DIAGNOSIS — Z23 Encounter for immunization: Secondary | ICD-10-CM | POA: Diagnosis not present

## 2021-12-09 DIAGNOSIS — Z91012 Allergy to eggs: Secondary | ICD-10-CM

## 2021-12-09 DIAGNOSIS — D8940 Mast cell activation, unspecified: Secondary | ICD-10-CM

## 2021-12-09 MED ORDER — LEVOCETIRIZINE DIHYDROCHLORIDE 5 MG PO TABS: 5.0000 mg | ORAL_TABLET | Freq: Two times a day (BID) | ORAL | 3 refills | Status: AC

## 2021-12-09 MED ORDER — FAMOTIDINE 40 MG PO TABS: 40.0000 mg | ORAL_TABLET | Freq: Two times a day (BID) | ORAL | 3 refills | Status: AC

## 2021-12-09 MED ORDER — EPINEPHRINE 0.3 MG/0.3ML IJ SOAJ: 0.3000 mg | INTRAMUSCULAR | 5 refills | Status: DC | PRN

## 2021-12-09 NOTE — Progress Notes (Signed)
Chief Complaint  Patient presents with   Annual Exam   F/u  1. 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  HENT:  Negative for hearing loss.   Eyes:  Negative for blurred vision.  Respiratory:  Negative for shortness of breath.   Cardiovascular:  Negative for chest pain.  Gastrointestinal:  Negative for abdominal pain and blood in stool.  Genitourinary:  Negative for dysuria.  Musculoskeletal:  Negative for falls and joint pain.  Skin:  Negative for rash.  Neurological:  Negative for headaches.  Psychiatric/Behavioral:  Negative for depression.    Past Medical History:  Diagnosis Date   Abrasion of wrist, right    right wrist sting wray laceration right wrist   Allergy    Chicken pox    COVID-19    10/2020, 11/2020   Exercise-induced asthma    GERD (gastroesophageal reflux disease)    Pneumonia 06/2017   Tick bite    Tricuspid regurgitation    Past Surgical History:  Procedure Laterality Date   CESAREAN SECTION N/A 09/23/2020   Procedure: CESAREAN SECTION;  Surgeon: Tyson Dense, MD;  Location: [place] [facility];  Service: Obstetrics;  Laterality: N/A;   WISDOM TOOTH EXTRACTION     Family History  Problem Relation Age of Onset   Hypertension Mother    Arthritis Maternal Grandmother    Alcohol abuse Maternal Grandfather    Diabetes Maternal Grandfather    Cancer Paternal Grandmother        breast    Cancer Paternal Grandfather        prostate    Miscarriages / [place] Sister    Social History   Socioeconomic History   Marital status: Married    Spouse name: [name]   Number of children: Not on file   Years of education: Not on file   Highest education level: Not on file  Occupational History   Not on file  Tobacco Use   Smoking status: Never   Smokeless tobacco: Never   Vaping Use   Vaping Use: Never used  Substance and Sexual Activity   Alcohol use: Not Currently   Drug use: Never    Comment: husband    Sexual activity: Yes    Partners: Male    Birth control/protection: None  Other Topics Concern   Not on file  Social History Narrative   Stay at home mom   From [place]    Married 2 sons    Bachelors degree    Used to play volleyballl, Basketball, track    Owns guns, wears seat belt, safe in relationship    Social Determinants of Health   Financial Resource Strain: Not on file  Food Insecurity: No Food Insecurity (01/17/2019)   Hunger Vital Sign    Worried About Running Out of Food in the Last Year: Never true    Ran Out of Food in the Last Year: Never true  Transportation Needs: No Transportation Needs (01/17/2019)   PRAPARE - Hydrologist (Medical): No    Lack of Transportation (Non-Medical): No  Physical Activity: Not on file  Stress: Not on file  Social Connections: Not on file  Intimate Partner Violence: Not on file   Current Meds  Medication Sig   albuterol (VENTOLIN HFA) 108 (90 Base) MCG/ACT  inhaler Inhale 2 puffs into the lungs as needed.   cromolyn (GASTROCROM) 100 MG/5ML solution Take 5 mLs (100 mg total) by mouth 4 (four) times daily -  before meals and at bedtime. 5 ml 30 minutes prior to each meal and 30 minutes prior to bed. Can start 2 doses daily. Dr. Lloyd Huger (Patient taking differently: Take 100 mg by mouth 4 (four) times daily -  before meals and at bedtime. 10 ml 30 minutes prior to each meal and 30 minutes prior to bed. Can start 2 doses daily. Dr. Lloyd Huger)   [DISCONTINUED] EPINEPHrine 0.3 mg/0.3 mL IJ SOAJ injection Inject 0.3 mg into the muscle as needed for anaphylaxis.   [DISCONTINUED] famotidine (PEPCID) 40 MG tablet Take 40 mg by mouth 2 (two) times daily.   [DISCONTINUED] levocetirizine (XYZAL) 5 MG tablet Take 5 mg by mouth in the morning and at bedtime.   Allergies  Allergen  Reactions   Eggs Or Egg-Derived Products Hives   Milk-Related Compounds Anaphylaxis    Stomach upset + hives   Tetracycline Hives    Sob   Peanut Butter Flavor    No results found for this or any previous visit (from the past 2160 hour(s)). Objective  Body mass index is 20.14 kg/m. Wt Readings from Last 3 Encounters:  12/09/21 128 lb 9.6 oz (58.3 kg)  11/03/21 125 lb (56.7 kg)  07/07/21 125 lb 6.4 oz (56.9 kg)   Temp Readings from Last 3 Encounters:  12/09/21 98.5 F (36.9 C) (Oral)  07/07/21 99.1 F (37.3 C) (Oral)  01/06/21 98.7 F (37.1 C) (Oral)   BP Readings from Last 3 Encounters:  12/09/21 116/60  07/07/21 104/70  04/01/21 118/76   Pulse Readings from Last 3 Encounters:  12/09/21 67  07/07/21 67  04/01/21 62    Physical Exam Vitals and nursing note reviewed.  Constitutional:      Appearance: Normal appearance. She is well-developed and well-groomed.  HENT:     Head: Normocephalic and atraumatic.  Eyes:     Conjunctiva/sclera: Conjunctivae normal.     Pupils: Pupils are equal, round, and reactive to light.  Cardiovascular:     Rate and Rhythm: Normal rate and regular rhythm.     Heart sounds: Normal heart sounds. No murmur heard. Pulmonary:     Effort: Pulmonary effort is normal.     Breath sounds: Normal breath sounds.  Abdominal:     General: Abdomen is flat. Bowel sounds are normal.     Tenderness: There is no abdominal tenderness.  Musculoskeletal:        General: No tenderness.  Skin:    General: Skin is warm and dry.  Neurological:     General: No focal deficit present.     Mental Status: She is alert and oriented to person, place, and time. Mental status is at baseline.     Cranial Nerves: Cranial nerves 2-12 are intact.     Gait: Gait is intact.  Psychiatric:        Attention and Perception: Attention and perception normal.        Mood and Affect: Mood and affect normal.        Speech: Speech normal.        Behavior: Behavior normal.  Behavior is cooperative.        Thought Content: Thought content normal.        Cognition and Memory: Cognition and memory normal.        Judgment: Judgment normal.  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 Mikey Kirschner  As of 06/2021 + IUD Ob/gyn Dr. Daneil Dan leger as of 07/2021 pap had in 2023 get records [facility] today   Get records [place] prior PCP Get records OB/GYN Dr. Patrick North pap given name of Dr. Georgianne Fick today LMP 07/16/17  -obtained records Dr. Christoper Fabian reviewed and scanned      Mammogram 39  Colonoscopy age 62   07/21/21 brenda appt dermatology [address]  Tbse  Provider: Dr. Olivia Mackie [name]-Internal Medicine

## 2021-12-12 ENCOUNTER — Other Ambulatory Visit (INDEPENDENT_AMBULATORY_CARE_PROVIDER_SITE_OTHER): Payer: 59

## 2021-12-12 DIAGNOSIS — Z1389 Encounter for screening for other disorder: Secondary | ICD-10-CM

## 2021-12-12 DIAGNOSIS — Z13818 Encounter for screening for other digestive system disorders: Secondary | ICD-10-CM

## 2021-12-12 DIAGNOSIS — E611 Iron deficiency: Secondary | ICD-10-CM

## 2021-12-12 DIAGNOSIS — Z1329 Encounter for screening for other suspected endocrine disorder: Secondary | ICD-10-CM | POA: Diagnosis not present

## 2021-12-12 DIAGNOSIS — Z13220 Encounter for screening for lipoid disorders: Secondary | ICD-10-CM | POA: Diagnosis not present

## 2021-12-12 DIAGNOSIS — Z0000 Encounter for general adult medical examination without abnormal findings: Secondary | ICD-10-CM

## 2021-12-12 DIAGNOSIS — E559 Vitamin D deficiency, unspecified: Secondary | ICD-10-CM | POA: Diagnosis not present

## 2021-12-12 LAB — COMPREHENSIVE METABOLIC PANEL
ALT: 15 U/L (ref 0–35)
AST: 16 U/L (ref 0–37)
Albumin: 4.3 g/dL (ref 3.5–5.2)
Alkaline Phosphatase: 48 U/L (ref 39–117)
BUN: 13 mg/dL (ref 6–23)
CO2: 28 mEq/L (ref 19–32)
Calcium: 9.1 mg/dL (ref 8.4–10.5)
Chloride: 106 mEq/L (ref 96–112)
Creatinine, Ser: 0.71 mg/dL (ref 0.40–1.20)
GFR: 112.27 mL/min (ref >60.00)
Glucose, Bld: 81 mg/dL (ref 70–99)
Potassium: 4 mEq/L (ref 3.5–5.1)
Sodium: 141 mEq/L (ref 135–145)
Total Bilirubin: 0.4 mg/dL (ref 0.2–1.2)
Total Protein: 6.5 g/dL (ref 6.0–8.3)

## 2021-12-12 LAB — CBC WITH DIFFERENTIAL/PLATELET
Basophils Absolute: 0 10*3/uL (ref 0.0–0.1)
Basophils Relative: 1.3 % (ref 0.0–3.0)
Eosinophils Absolute: 0.1 10*3/uL (ref 0.0–0.7)
Eosinophils Relative: 4.3 % (ref 0.0–5.0)
HCT: 37.7 % (ref 36.0–46.0)
Hemoglobin: 12.8 g/dL (ref 12.0–15.0)
Lymphocytes Relative: 51.8 % — ABNORMAL HIGH (ref 12.0–46.0)
Lymphs Abs: 1.2 10*3/uL (ref 0.7–4.0)
MCHC: 33.9 g/dL (ref 30.0–36.0)
MCV: 91.6 fl (ref 78.0–100.0)
Monocytes Absolute: 0.2 10*3/uL (ref 0.1–1.0)
Monocytes Relative: 9.6 % (ref 3.0–12.0)
Neutro Abs: 0.8 10*3/uL — ABNORMAL LOW (ref 1.4–7.7)
Neutrophils Relative %: 33 % — ABNORMAL LOW (ref 43.0–77.0)
Platelets: 155 10*3/uL (ref 150.0–400.0)
RBC: 4.12 Mil/uL (ref 3.87–5.11)
RDW: 12.4 % (ref 11.5–15.5)
WBC: 2.3 10*3/uL — ABNORMAL LOW (ref 4.0–10.5)

## 2021-12-12 LAB — LIPID PANEL
Cholesterol: 149 mg/dL (ref 0–200)
HDL: 53.7 mg/dL (ref >39.00)
LDL Cholesterol: 82 mg/dL (ref 0–99)
NonHDL: 94.83
Total CHOL/HDL Ratio: 3
Triglycerides: 62 mg/dL (ref 0.0–149.0)
VLDL: 12.4 mg/dL (ref 0.0–40.0)

## 2021-12-12 LAB — IBC + FERRITIN
Ferritin: 23.2 ng/mL (ref 10.0–291.0)
Iron: 99 ug/dL (ref 42–145)
Saturation Ratios: 32.9 % (ref 20.0–50.0)
TIBC: 301 ug/dL (ref 250.0–450.0)
Transferrin: 215 mg/dL (ref 212.0–360.0)

## 2021-12-12 LAB — TSH: TSH: 2.9 u[IU]/mL (ref 0.35–5.50)

## 2021-12-12 LAB — VITAMIN D 25 HYDROXY (VIT D DEFICIENCY, FRACTURES): VITD: 34.11 ng/mL (ref 30.00–100.00)

## 2021-12-13 ENCOUNTER — Encounter: Payer: Self-pay | Admitting: Internal Medicine

## 2021-12-13 ENCOUNTER — Other Ambulatory Visit: Payer: Self-pay | Admitting: Internal Medicine

## 2021-12-13 DIAGNOSIS — E538 Deficiency of other specified B group vitamins: Secondary | ICD-10-CM

## 2021-12-13 DIAGNOSIS — D72819 Decreased white blood cell count, unspecified: Secondary | ICD-10-CM

## 2021-12-13 LAB — URINALYSIS, ROUTINE W REFLEX MICROSCOPIC
Bilirubin Urine: NEGATIVE
Glucose, UA: NEGATIVE
Hgb urine dipstick: NEGATIVE
Ketones, ur: NEGATIVE
Leukocytes,Ua: NEGATIVE
Nitrite: NEGATIVE
Protein, ur: NEGATIVE
Specific Gravity, Urine: 1.004 (ref 1.001–1.035)
pH: 6.5 (ref 5.0–8.0)

## 2021-12-13 LAB — HEPATITIS C ANTIBODY: Hepatitis C Ab: NONREACTIVE

## 2021-12-14 ENCOUNTER — Telehealth: Payer: Self-pay

## 2021-12-14 NOTE — Telephone Encounter (Signed)
LMOM for pt to CB in regards to labs 

## 2021-12-15 ENCOUNTER — Encounter: Payer: Self-pay | Admitting: Internal Medicine

## 2022-01-09 ENCOUNTER — Other Ambulatory Visit: Payer: 59

## 2022-01-12 ENCOUNTER — Other Ambulatory Visit: Payer: 59

## 2022-01-18 ENCOUNTER — Other Ambulatory Visit (INDEPENDENT_AMBULATORY_CARE_PROVIDER_SITE_OTHER): Payer: 59

## 2022-01-18 DIAGNOSIS — E538 Deficiency of other specified B group vitamins: Secondary | ICD-10-CM | POA: Diagnosis not present

## 2022-01-18 DIAGNOSIS — D72819 Decreased white blood cell count, unspecified: Secondary | ICD-10-CM

## 2022-01-18 LAB — FOLATE: Folate: >23.8 ng/mL (ref >5.9)

## 2022-01-18 LAB — CBC WITH DIFFERENTIAL/PLATELET
Basophils Absolute: 0 10*3/uL (ref 0.0–0.1)
Basophils Relative: 0.8 % (ref 0.0–3.0)
Eosinophils Absolute: 0.1 10*3/uL (ref 0.0–0.7)
Eosinophils Relative: 2.4 % (ref 0.0–5.0)
HCT: 33.2 % — ABNORMAL LOW (ref 36.0–46.0)
Hemoglobin: 11.3 g/dL — ABNORMAL LOW (ref 12.0–15.0)
Lymphocytes Relative: 56.3 % — ABNORMAL HIGH (ref 12.0–46.0)
Lymphs Abs: 1.7 10*3/uL (ref 0.7–4.0)
MCHC: 34.1 g/dL (ref 30.0–36.0)
MCV: 89.4 fl (ref 78.0–100.0)
Monocytes Absolute: 0.3 10*3/uL (ref 0.1–1.0)
Monocytes Relative: 9.6 % (ref 3.0–12.0)
Neutro Abs: 0.9 10*3/uL — ABNORMAL LOW (ref 1.4–7.7)
Neutrophils Relative %: 30.9 % — ABNORMAL LOW (ref 43.0–77.0)
Platelets: 137 10*3/uL — ABNORMAL LOW (ref 150.0–400.0)
RBC: 3.72 Mil/uL — ABNORMAL LOW (ref 3.87–5.11)
RDW: 12.6 % (ref 11.5–15.5)
WBC: 3 10*3/uL — ABNORMAL LOW (ref 4.0–10.5)

## 2022-01-18 LAB — VITAMIN B12: Vitamin B-12: 1340 pg/mL — ABNORMAL HIGH (ref 211–911)

## 2022-01-20 ENCOUNTER — Telehealth: Payer: Self-pay

## 2022-01-20 NOTE — Telephone Encounter (Signed)
Patient states she saw her results online and a lot of them were abnormal.  Patient states she would like to speak with someone about this and determine next steps.

## 2022-01-23 ENCOUNTER — Encounter: Payer: Self-pay | Admitting: Family Medicine

## 2022-01-24 ENCOUNTER — Other Ambulatory Visit: Payer: Self-pay | Admitting: Family

## 2022-01-24 NOTE — Telephone Encounter (Signed)
Please send to [facility]

## 2022-01-24 NOTE — Telephone Encounter (Signed)
Spoke with pt, she is still concerned due to Dr. Olivia Mackie ordering the recheck on her hemoglobin and B12 levels before. She stated the last time she spoke with Dr. Olivia Mackie she stated if her levels were low still she would refer her to hematology.

## 2022-01-25 ENCOUNTER — Other Ambulatory Visit: Payer: Self-pay | Admitting: Family

## 2022-01-25 DIAGNOSIS — D649 Anemia, unspecified: Secondary | ICD-10-CM

## 2022-01-30 DIAGNOSIS — D8940 Mast cell activation, unspecified: Secondary | ICD-10-CM | POA: Diagnosis not present

## 2022-01-30 DIAGNOSIS — D61818 Other pancytopenia: Secondary | ICD-10-CM | POA: Diagnosis not present

## 2022-02-02 ENCOUNTER — Inpatient Hospital Stay: Payer: 59 | Attending: Oncology | Admitting: Oncology

## 2022-02-02 ENCOUNTER — Inpatient Hospital Stay: Payer: 59

## 2022-02-02 VITALS — BP 109/75 | HR 88 | Temp 97.7°F | Resp 18 | Ht 67.0 in | Wt 127.6 lb

## 2022-02-02 DIAGNOSIS — D61818 Other pancytopenia: Secondary | ICD-10-CM | POA: Diagnosis not present

## 2022-02-02 LAB — CBC
HCT: 37.9 % (ref 36.0–46.0)
Hemoglobin: 12.4 g/dL (ref 12.0–15.0)
MCH: 29.7 pg (ref 26.0–34.0)
MCHC: 32.7 g/dL (ref 30.0–36.0)
MCV: 90.7 fL (ref 80.0–100.0)
Platelets: 181 10*3/uL (ref 150–400)
RBC: 4.18 MIL/uL (ref 3.87–5.11)
RDW: 12.7 % (ref 11.5–15.5)
WBC: 3 10*3/uL — ABNORMAL LOW (ref 4.0–10.5)
nRBC: 0 % (ref 0.0–0.2)

## 2022-02-02 LAB — LACTATE DEHYDROGENASE: LDH: 236 U/L — ABNORMAL HIGH (ref 98–192)

## 2022-02-02 NOTE — Progress Notes (Signed)
Had multiple medical issues and immunologists, mast cell disorder. Recently has become anemic.

## 2022-02-02 NOTE — Progress Notes (Signed)
[place]  Telephone:(336) [phone] Fax:(336) [phone]  ID: Alison Harrison OB: 1990/07/27  MR#: [id number]  VOJ#:[id number]  Patient Care Team: [name], [name], MD as PCP - General (Internal Medicine)  CHIEF COMPLAINT: Pancytopenia.  INTERVAL HISTORY: Patient is a 31 year old female who was noted to have a mild pancytopenia on routine blood work.  She recently developed mast cell activation syndrome postpartum only approximately 1 year ago, but her current medical regimen keeps her asymptomatic.  She currently feels well.  She has no neurologic complaints.  She denies any recent fevers or illnesses.  She has a good appetite and denies weight loss.  She has no chest pain, shortness of breath, cough, or hemoptysis.  She denies any nausea, vomiting, constipation, or diarrhea.  She has no urinary complaints.  Patient otherwise feels well and offers no further specific complaints today.    REVIEW OF SYSTEMS:   Review of Systems  Constitutional: Negative.  Negative for fever, malaise/fatigue and weight loss.  Respiratory: Negative.  Negative for cough, hemoptysis and shortness of breath.   Cardiovascular: Negative.  Negative for chest pain and leg swelling.  Gastrointestinal:  Negative for abdominal pain.  Genitourinary: Negative.  Negative for dysuria.  Musculoskeletal: Negative.  Negative for back pain.  Skin: Negative.  Negative for rash.  Neurological: Negative.  Negative for dizziness, focal weakness, weakness and headaches.  Psychiatric/Behavioral: Negative.  The patient is not nervous/anxious.     As per HPI. Otherwise, a complete review of systems is negative.  PAST MEDICAL HISTORY: Past Medical History:  Diagnosis Date   Abrasion of wrist, right    right wrist sting wray laceration right wrist   Allergy    Chicken pox    COVID-19    10/2020, 11/2020   Exercise-induced asthma    GERD (gastroesophageal reflux disease)    Kidney stones     Pneumonia 06/2017   Tick bite    Tricuspid regurgitation     PAST SURGICAL HISTORY: Past Surgical History:  Procedure Laterality Date   CESAREAN SECTION N/A 09/23/2020   Procedure: CESAREAN SECTION;  Surgeon: Tyson Dense, MD;  Location: [place] [facility] OR[facility];  Service: Obstetrics;  Laterality: N/A;   WISDOM TOOTH EXTRACTION      FAMILY HISTORY: Family History  Problem Relation Age of Onset   Hypertension Mother    Arthritis Maternal Grandmother    Alcohol abuse Maternal Grandfather    Diabetes Maternal Grandfather    Cancer Paternal Grandmother        breast    Cancer Paternal Grandfather        prostate    Miscarriages / Stillbirths Sister     ADVANCED DIRECTIVES (Y/N):  N  HEALTH MAINTENANCE: Social History   Tobacco Use   Smoking status: Never   Smokeless tobacco: Never  Vaping Use   Vaping Use: Never used  Substance Use Topics   Alcohol use: Not Currently   Drug use: Never    Comment: husband      Colonoscopy:  PAP:  Bone density:  Lipid panel:  Allergies  Allergen Reactions   Eggs Or Egg-Derived Products Hives   Milk-Related Compounds Anaphylaxis    Stomach upset + hives   Tetracycline Hives    Sob   Other     Peanuts    Current Outpatient Medications  Medication Sig Dispense Refill   albuterol (VENTOLIN HFA) 108 (90 Base) MCG/ACT inhaler Inhale 2 puffs into the lungs as needed.     cromolyn (GASTROCROM)  100 MG/5ML solution Take 5 mLs (100 mg total) by mouth 4 (four) times daily -  before meals and at bedtime. 5 ml 30 minutes prior to each meal and 30 minutes prior to bed. Can start 2 doses daily. Dr. Lloyd Huger (Patient taking differently: Take 100 mg by mouth 4 (four) times daily -  before meals and at bedtime. 10 ml 30 minutes prior to each meal and 30 minutes prior to bed. Can start 2 doses daily. Dr. Lloyd Huger) 480 mL    famotidine (PEPCID) 40 MG tablet Take 1 tablet (40 mg total) by mouth 2 (two) times daily. 180 tablet 3   levocetirizine (XYZAL) 5 MG  tablet Take 1 tablet (5 mg total) by mouth in the morning and at bedtime. 180 tablet 3   EPINEPHrine 0.3 mg/0.3 mL IJ SOAJ injection Inject 0.3 mg into the muscle as needed for anaphylaxis. (Patient not taking: Reported on 02/02/2022) 1 each 5   No current facility-administered medications for this visit.    OBJECTIVE: Vitals:   02/02/22 1601  BP: 109/75  Pulse: 88  Resp: 18  Temp: 97.7 F (36.5 C)  SpO2: 100%     Body mass index is 19.98 kg/m.    ECOG FS:0 - Asymptomatic  General: Well-developed, well-nourished, no acute distress. Eyes: Pink conjunctiva, anicteric sclera. HEENT: Normocephalic, moist mucous membranes. Lungs: No audible wheezing or coughing. Heart: Regular rate and rhythm. Abdomen: Soft, nontender, no obvious distention. Musculoskeletal: No edema, cyanosis, or clubbing. Neuro: Alert, answering all questions appropriately. Cranial nerves grossly intact. Skin: No rashes or petechiae noted. Psych: Normal affect. Lymphatics: No cervical, calvicular, axillary or inguinal LAD.   LAB RESULTS:  Lab Results  Component Value Date   NA 141 12/12/2021   K 4.0 12/12/2021   CL 106 12/12/2021   CO2 28 12/12/2021   GLUCOSE 81 12/12/2021   BUN 13 12/12/2021   CREATININE 0.71 12/12/2021   CALCIUM 9.1 12/12/2021   PROT 6.5 12/12/2021   ALBUMIN 4.3 12/12/2021   AST 16 12/12/2021   ALT 15 12/12/2021   ALKPHOS 48 12/12/2021   BILITOT 0.4 12/12/2021   GFRNONAA >60 09/26/2020   GFRAA >60 01/17/2019    Lab Results  Component Value Date   WBC 3.0 (L) 02/02/2022   NEUTROABS 0.9 (L) 01/18/2022   HGB 12.4 02/02/2022   HCT 37.9 02/02/2022   MCV 90.7 02/02/2022   PLT 181 02/02/2022     STUDIES: No results found.  ASSESSMENT: Pancytopenia.  PLAN:    Pancytopenia: Repeat laboratory work from today revealed normal hemoglobin and normal platelet count, but her white blood cell count remains mildly reduced.  Previously, iron stores, folate, B12 were all within  normal limits.  Platelet antibody profile, neutrophil antibodies, and peripheral blood flow cytometry were sent for completeness and are pending at time of dictation.  No intervention is needed at this time.  Patient does not require bone marrow biopsy. Patient will have video assisted telemedicine visit in 2 weeks to discuss her results.  I spent a total of 45 minutes reviewing chart data, face-to-face evaluation with the patient, counseling and coordination of care as detailed above.   Patient expressed understanding and was in agreement with this plan. She also understands that She can call clinic at any time with any questions, concerns, or complaints.    Lloyd Huger, MD   02/03/2022 12:03 PM

## 2022-02-04 LAB — COMP PANEL: LEUKEMIA/LYMPHOMA

## 2022-02-04 LAB — PLATELET ANTIBODY PROFILE
Glycoprotein IV Antibody: NEGATIVE
HLA Ab Ser Ql EIA: NEGATIVE
IA/IIA Antibody: NEGATIVE
IB/IX Antibody: NEGATIVE
IIB/IIIA Antibody: NEGATIVE

## 2022-02-04 LAB — HAPTOGLOBIN: Haptoglobin: 88 mg/dL (ref 33–278)

## 2022-02-07 DIAGNOSIS — R69 Illness, unspecified: Secondary | ICD-10-CM | POA: Diagnosis not present

## 2022-02-07 DIAGNOSIS — F4010 Social phobia, unspecified: Secondary | ICD-10-CM | POA: Diagnosis not present

## 2022-02-07 DIAGNOSIS — F4310 Post-traumatic stress disorder, unspecified: Secondary | ICD-10-CM | POA: Diagnosis not present

## 2022-02-09 LAB — NEUTROPHIL AB TEST LEVEL 1: NEUTROPHIL SCR/PANEL INTERP.: POSITIVE — AB

## 2022-02-11 LAB — COPPER, SERUM: Copper: 154 ug/dL (ref 80–158)

## 2022-02-16 ENCOUNTER — Inpatient Hospital Stay: Payer: 59 | Attending: Oncology | Admitting: Oncology

## 2022-02-16 DIAGNOSIS — D72819 Decreased white blood cell count, unspecified: Secondary | ICD-10-CM | POA: Diagnosis not present

## 2022-02-16 NOTE — Progress Notes (Signed)
[place]  [phone]) [phone] Fax:(336) [phone]  ID: Alison Harrison OB: [birth year]  MR#: [id number]  IOM#:[id number]  Patient Care Team: [name], [name], MD as PCP - General (Internal Medicine)  I connected with Alison Harrison on 02/16/22 at  3:30 PM EST by video enabled telemedicine visit and verified that I am speaking with the correct person using two identifiers.   I discussed the limitations, risks, security and privacy concerns of performing an evaluation and management service by telemedicine and the availability of in-person appointments. I also discussed with the patient that there may be a patient responsible charge related to this service. The patient expressed understanding and agreed to proceed.   Other persons participating in the visit and their role in the encounter: Patient, MD.  Patient's location: Home. Provider's location: Clinic.  CHIEF COMPLAINT: Leukopenia.  INTERVAL HISTORY: Patient agreed to video assisted telemedicine visit for further evaluation and discussion of her laboratory results.  She continues to feel well and remains asymptomatic.  She has no neurologic complaints.  She denies any recent fevers or illnesses.  She has a good appetite and denies weight loss.  She has no chest pain, shortness of breath, cough, or hemoptysis.  She denies any nausea, vomiting, constipation, or diarrhea.  She has no urinary complaints.  Patient offers no specific complaints today.  REVIEW OF SYSTEMS:   Review of Systems  Constitutional: Negative.  Negative for fever, malaise/fatigue and weight loss.  Respiratory: Negative.  Negative for cough, hemoptysis and shortness of breath.   Cardiovascular: Negative.  Negative for chest pain and leg swelling.  Gastrointestinal:  Negative for abdominal pain.  Genitourinary: Negative.  Negative for dysuria.  Musculoskeletal: Negative.  Negative for back pain.  Skin: Negative.  Negative  for rash.  Neurological: Negative.  Negative for dizziness, focal weakness, weakness and headaches.  Psychiatric/Behavioral: Negative.  The patient is not nervous/anxious.     As per HPI. Otherwise, a complete review of systems is negative.  PAST MEDICAL HISTORY: Past Medical History:  Diagnosis Date   Abrasion of wrist, right    right wrist sting wray laceration right wrist   Allergy    Chicken pox    COVID-19    10/2020, 11/2020   Exercise-induced asthma    GERD (gastroesophageal reflux disease)    Kidney stones    Pneumonia 06/2017   Tick bite    Tricuspid regurgitation     PAST SURGICAL HISTORY: Past Surgical History:  Procedure Laterality Date   CESAREAN SECTION N/A 09/23/2020   Procedure: CESAREAN SECTION;  Surgeon: Tyson Dense, MD;  Location: [place] [facility];  Service: Obstetrics;  Laterality: N/A;   WISDOM TOOTH EXTRACTION      FAMILY HISTORY: Family History  Problem Relation Age of Onset   Hypertension Mother    Arthritis Maternal Grandmother    Alcohol abuse Maternal Grandfather    Diabetes Maternal Grandfather    Cancer Paternal Grandmother        breast    Cancer Paternal Grandfather        prostate    Miscarriages / Stillbirths Sister     ADVANCED DIRECTIVES (Y/N):  N  HEALTH MAINTENANCE: Social History   Tobacco Use   Smoking status: Never   Smokeless tobacco: Never  Vaping Use   Vaping Use: Never used  Substance Use Topics   Alcohol use: Not Currently   Drug use: Never    Comment: husband      Colonoscopy:  PAP:  Bone density:  Lipid panel:  Allergies  Allergen Reactions   Eggs Or Egg-Derived Products Hives   Milk-Related Compounds Anaphylaxis    Stomach upset + hives   Tetracycline Hives    Sob   Other     Peanuts    Current Outpatient Medications  Medication Sig Dispense Refill   albuterol (VENTOLIN HFA) 108 (90 Base) MCG/ACT inhaler Inhale 2 puffs into the lungs as needed.     cromolyn (GASTROCROM) 100 MG/5ML solution  Take 5 mLs (100 mg total) by mouth 4 (four) times daily -  before meals and at bedtime. 5 ml 30 minutes prior to each meal and 30 minutes prior to bed. Can start 2 doses daily. Dr. Lloyd Huger (Patient taking differently: Take 100 mg by mouth 4 (four) times daily -  before meals and at bedtime. 10 ml 30 minutes prior to each meal and 30 minutes prior to bed. Can start 2 doses daily. Dr. Lloyd Huger) 480 mL    EPINEPHrine 0.3 mg/0.3 mL IJ SOAJ injection Inject 0.3 mg into the muscle as needed for anaphylaxis. (Patient not taking: Reported on 02/02/2022) 1 each 5   famotidine (PEPCID) 40 MG tablet Take 1 tablet (40 mg total) by mouth 2 (two) times daily. 180 tablet 3   levocetirizine (XYZAL) 5 MG tablet Take 1 tablet (5 mg total) by mouth in the morning and at bedtime. 180 tablet 3   No current facility-administered medications for this visit.    OBJECTIVE: There were no vitals filed for this visit.    There is no height or weight on file to calculate BMI.    ECOG FS:0 - Asymptomatic  General: Well-developed, well-nourished, no acute distress. HEENT: Normocephalic. Neuro: Alert, answering all questions appropriately. Cranial nerves grossly intact. Psych: Normal affect.  LAB RESULTS:  Lab Results  Component Value Date   NA 141 12/12/2021   K 4.0 12/12/2021   CL 106 12/12/2021   CO2 28 12/12/2021   GLUCOSE 81 12/12/2021   BUN 13 12/12/2021   CREATININE 0.71 12/12/2021   CALCIUM 9.1 12/12/2021   PROT 6.5 12/12/2021   ALBUMIN 4.3 12/12/2021   AST 16 12/12/2021   ALT 15 12/12/2021   ALKPHOS 48 12/12/2021   BILITOT 0.4 12/12/2021   GFRNONAA >60 09/26/2020   GFRAA >60 01/17/2019    Lab Results  Component Value Date   WBC 3.0 (L) 02/02/2022   NEUTROABS 0.9 (L) 01/18/2022   HGB 12.4 02/02/2022   HCT 37.9 02/02/2022   MCV 90.7 02/02/2022   PLT 181 02/02/2022     STUDIES: No results found.  ASSESSMENT: Leukopenia.  PLAN:    Leukopenia: Patient has a mildly decreased white blood cell  count at 3.0.  She was noted to have a positive antineutrophil antibody. There is a possibility that this can be transient in nature or a false positive secondary to her underlying mast cell activation syndrome.  All of her other laboratory work is either negative or within normal limits.  No intervention is needed at this time.  Patient does not require treatment.  Return to clinic in 3 months with repeat laboratory work and video assisted telemedicine visit. Anemia: Resolved. Thrombocytopenia: Resolved.  I provided 20 minutes of face-to-face video visit time during this encounter which included chart review, counseling, and coordination of care as documented above.    Patient expressed understanding and was in agreement with this plan. She also understands that She can call clinic at any time with any questions, concerns, or  complaints.    Lloyd Huger, MD   02/16/2022 3:37 PM

## 2022-05-15 ENCOUNTER — Inpatient Hospital Stay: Payer: 59 | Attending: Oncology

## 2022-05-15 DIAGNOSIS — D708 Other neutropenia: Secondary | ICD-10-CM | POA: Insufficient documentation

## 2022-05-15 DIAGNOSIS — D72819 Decreased white blood cell count, unspecified: Secondary | ICD-10-CM

## 2022-05-15 DIAGNOSIS — D649 Anemia, unspecified: Secondary | ICD-10-CM | POA: Diagnosis not present

## 2022-05-15 DIAGNOSIS — D696 Thrombocytopenia, unspecified: Secondary | ICD-10-CM | POA: Insufficient documentation

## 2022-05-15 LAB — CBC WITH DIFFERENTIAL/PLATELET
Abs Immature Granulocytes: 0 10*3/uL (ref 0.00–0.07)
Basophils Absolute: 0 10*3/uL (ref 0.0–0.1)
Basophils Relative: 1 %
Eosinophils Absolute: 0.2 10*3/uL (ref 0.0–0.5)
Eosinophils Relative: 6 %
HCT: 34.9 % — ABNORMAL LOW (ref 36.0–46.0)
Hemoglobin: 11.5 g/dL — ABNORMAL LOW (ref 12.0–15.0)
Immature Granulocytes: 0 %
Lymphocytes Relative: 32 %
Lymphs Abs: 1 10*3/uL (ref 0.7–4.0)
MCH: 29.7 pg (ref 26.0–34.0)
MCHC: 33 g/dL (ref 30.0–36.0)
MCV: 90.2 fL (ref 80.0–100.0)
Monocytes Absolute: 0.2 10*3/uL (ref 0.1–1.0)
Monocytes Relative: 7 %
Neutro Abs: 1.7 10*3/uL (ref 1.7–7.7)
Neutrophils Relative %: 54 %
Platelets: 142 10*3/uL — ABNORMAL LOW (ref 150–400)
RBC: 3.87 MIL/uL (ref 3.87–5.11)
RDW: 12.1 % (ref 11.5–15.5)
WBC: 3.1 10*3/uL — ABNORMAL LOW (ref 4.0–10.5)
nRBC: 0 % (ref 0.0–0.2)

## 2022-05-16 ENCOUNTER — Inpatient Hospital Stay (HOSPITAL_BASED_OUTPATIENT_CLINIC_OR_DEPARTMENT_OTHER): Payer: 59 | Admitting: Oncology

## 2022-05-16 ENCOUNTER — Encounter: Payer: Self-pay | Admitting: Oncology

## 2022-05-16 DIAGNOSIS — D72819 Decreased white blood cell count, unspecified: Secondary | ICD-10-CM | POA: Diagnosis not present

## 2022-05-16 NOTE — Progress Notes (Signed)
No new concerns 

## 2022-05-16 NOTE — Progress Notes (Signed)
[place]  [phone]) [phone] Fax:(336) [phone]  ID: Alison Harrison OB: [birth year]  MR#: [id number]  [id number]  Patient Care Team: Pcp, No as PCP - General  I connected with Alison Harrison on 05/16/22 at  3:30 PM EDT by video enabled telemedicine visit and verified that I am speaking with the correct person using two identifiers.   I discussed the limitations, risks, security and privacy concerns of performing an evaluation and management service by telemedicine and the availability of in-person appointments. I also discussed with the patient that there may be a patient responsible charge related to this service. The patient expressed understanding and agreed to proceed.   Other persons participating in the visit and their role in the encounter: Patient, MD.  Patient's location: Home. Provider's location: Clinic.  CHIEF COMPLAINT: Autoimmune neutropenia.  INTERVAL HISTORY: Patient agreed to video assisted telemedicine visit for further evaluation and discussion of her laboratory results.  She continues to feel well and remains asymptomatic. She has no neurologic complaints.  She denies any recent fevers or illnesses.  She has a good appetite and denies weight loss.  She has no chest pain, shortness of breath, cough, or hemoptysis.  She denies any nausea, vomiting, constipation, or diarrhea.  She has no urinary complaints.  Patient offers no specific complaints today.  REVIEW OF SYSTEMS:   Review of Systems  Constitutional: Negative.  Negative for fever, malaise/fatigue and weight loss.  Respiratory: Negative.  Negative for cough, hemoptysis and shortness of breath.   Cardiovascular: Negative.  Negative for chest pain and leg swelling.  Gastrointestinal:  Negative for abdominal pain.  Genitourinary: Negative.  Negative for dysuria.  Musculoskeletal: Negative.  Negative for back pain.  Skin: Negative.  Negative for rash.  Neurological:  Negative.  Negative for dizziness, focal weakness, weakness and headaches.  Psychiatric/Behavioral: Negative.  The patient is not nervous/anxious.     As per HPI. Otherwise, a complete review of systems is negative.  PAST MEDICAL HISTORY: Past Medical History:  Diagnosis Date   Abrasion of wrist, right    right wrist sting wray laceration right wrist   Allergy    Chicken pox    COVID-19    10/2020, 11/2020   Exercise-induced asthma    GERD (gastroesophageal reflux disease)    Kidney stones    Pneumonia 06/2017   Tick bite    Tricuspid regurgitation     PAST SURGICAL HISTORY: Past Surgical History:  Procedure Laterality Date   CESAREAN SECTION N/A 09/23/2020   Procedure: CESAREAN SECTION;  Surgeon: Tyson Dense, MD;  Location: [facility];  Service: Obstetrics;  Laterality: N/A;   WISDOM TOOTH EXTRACTION      FAMILY HISTORY: Family History  Problem Relation Age of Onset   Hypertension Mother    Arthritis Maternal Grandmother    Alcohol abuse Maternal Grandfather    Diabetes Maternal Grandfather    Cancer Paternal Grandmother        breast    Cancer Paternal Grandfather        prostate    Miscarriages / Stillbirths Sister     ADVANCED DIRECTIVES (Y/N):  N  HEALTH MAINTENANCE: Social History   Tobacco Use   Smoking status: Never   Smokeless tobacco: Never  Vaping Use   Vaping Use: Never used  Substance Use Topics   Alcohol use: Not Currently   Drug use: Never    Comment: husband      Colonoscopy:  PAP:  Bone density:  Lipid panel:  Allergies  Allergen Reactions   Egg-Derived Products Hives   Milk-Related Compounds Anaphylaxis    Stomach upset + hives   Tetracycline Hives    Sob   Other Swelling    Peanuts    Current Outpatient Medications  Medication Sig Dispense Refill   albuterol (VENTOLIN HFA) 108 (90 Base) MCG/ACT inhaler Inhale 2 puffs into the lungs as needed.     cromolyn (GASTROCROM) 100 MG/5ML solution Take 5 mLs (100 mg total)  by mouth 4 (four) times daily -  before meals and at bedtime. 5 ml 30 minutes prior to each meal and 30 minutes prior to bed. Can start 2 doses daily. Dr. Lloyd Huger (Patient taking differently: Take 100 mg by mouth 4 (four) times daily -  before meals and at bedtime. 10 ml 30 minutes prior to each meal and 30 minutes prior to bed. Can start 2 doses daily. Dr. Lloyd Huger) 480 mL    EPINEPHrine 0.3 mg/0.3 mL IJ SOAJ injection Inject 0.3 mg into the muscle as needed for anaphylaxis. 1 each 5   famotidine (PEPCID) 40 MG tablet Take 1 tablet (40 mg total) by mouth 2 (two) times daily. 180 tablet 3   levocetirizine (XYZAL) 5 MG tablet Take 1 tablet (5 mg total) by mouth in the morning and at bedtime. 180 tablet 3   levonorgestrel (MIRENA) 20 MCG/DAY IUD 1 each by Intrauterine route once.     No current facility-administered medications for this visit.    OBJECTIVE: There were no vitals filed for this visit.    There is no height or weight on file to calculate BMI.    ECOG FS:0 - Asymptomatic  General: Well-developed, well-nourished, no acute distress. HEENT: Normocephalic. Neuro: Alert, answering all questions appropriately. Cranial nerves grossly intact. Psych: Normal affect.  LAB RESULTS:  Lab Results  Component Value Date   NA 141 12/12/2021   K 4.0 12/12/2021   CL 106 12/12/2021   CO2 28 12/12/2021   GLUCOSE 81 12/12/2021   BUN 13 12/12/2021   CREATININE 0.71 12/12/2021   CALCIUM 9.1 12/12/2021   PROT 6.5 12/12/2021   ALBUMIN 4.3 12/12/2021   AST 16 12/12/2021   ALT 15 12/12/2021   ALKPHOS 48 12/12/2021   BILITOT 0.4 12/12/2021   GFRNONAA >60 09/26/2020   GFRAA >60 01/17/2019    Lab Results  Component Value Date   WBC 3.1 (L) 05/15/2022   NEUTROABS 1.7 05/15/2022   HGB 11.5 (L) 05/15/2022   HCT 34.9 (L) 05/15/2022   MCV 90.2 05/15/2022   PLT 142 (L) 05/15/2022     STUDIES: No results found.  ASSESSMENT: Autoimmune neutropenia.  PLAN:    Autoimmune neutropenia: Patient's  total white blood cell count remains persistently decreased at 3.1.  She was previously noted to have positive neutrophil antibodies.  Repeat testing is pending at time of dictation. There is a possibility that this can be transient in nature or a false positive secondary to her underlying mast cell activation syndrome.  Previously, all of her other laboratory work was either negative or within normal limits.  No intervention is needed at this time.  Patient does not require treatment.  If treatment were necessary, would will try trial of burst of prednisone.  We agreed upon that no further follow-up is necessary.  Please refer patient back if there are any questions or concerns.   Anemia: Mild, monitor. Thrombocytopenia: Mild, monitor.  I provided 20 minutes of face-to-face video visit time during this encounter  which included chart review, counseling, and coordination of care as documented above.    Patient expressed understanding and was in agreement with this plan. She also understands that She can call clinic at any time with any questions, concerns, or complaints.    Lloyd Huger, MD   05/16/2022 6:32 PM

## 2022-05-24 DIAGNOSIS — M6208 Separation of muscle (nontraumatic), other site: Secondary | ICD-10-CM | POA: Diagnosis not present

## 2022-05-24 DIAGNOSIS — Z309 Encounter for contraceptive management, unspecified: Secondary | ICD-10-CM | POA: Diagnosis not present

## 2022-05-24 DIAGNOSIS — Z6820 Body mass index (BMI) 20.0-20.9, adult: Secondary | ICD-10-CM | POA: Diagnosis not present

## 2022-05-24 DIAGNOSIS — Z01419 Encounter for gynecological examination (general) (routine) without abnormal findings: Secondary | ICD-10-CM | POA: Diagnosis not present

## 2022-05-24 DIAGNOSIS — Z9889 Other specified postprocedural states: Secondary | ICD-10-CM | POA: Diagnosis not present

## 2022-05-25 LAB — NEUTROPHIL AB TEST LEVEL 1: NEUTROPHIL SCR/PANEL INTERP.: NEGATIVE

## 2022-06-12 ENCOUNTER — Encounter: Payer: Self-pay | Admitting: Family Medicine

## 2022-06-12 ENCOUNTER — Ambulatory Visit: Payer: 59 | Admitting: Family Medicine

## 2022-06-12 VITALS — BP 90/60 | HR 71 | Temp 98.4°F | Ht 67.0 in | Wt 131.6 lb

## 2022-06-12 DIAGNOSIS — R21 Rash and other nonspecific skin eruption: Secondary | ICD-10-CM | POA: Diagnosis not present

## 2022-06-12 DIAGNOSIS — J45990 Exercise induced bronchospasm: Secondary | ICD-10-CM

## 2022-06-12 DIAGNOSIS — D72819 Decreased white blood cell count, unspecified: Secondary | ICD-10-CM | POA: Diagnosis not present

## 2022-06-12 DIAGNOSIS — D8940 Mast cell activation, unspecified: Secondary | ICD-10-CM

## 2022-06-12 DIAGNOSIS — T783XXD Angioneurotic edema, subsequent encounter: Secondary | ICD-10-CM

## 2022-06-12 DIAGNOSIS — O039 Complete or unspecified spontaneous abortion without complication: Secondary | ICD-10-CM | POA: Insufficient documentation

## 2022-06-12 DIAGNOSIS — L508 Other urticaria: Secondary | ICD-10-CM | POA: Diagnosis not present

## 2022-06-12 MED ORDER — TRIAMCINOLONE ACETONIDE 0.5 % EX CREA: TOPICAL_CREAM | Freq: Two times a day (BID) | CUTANEOUS | 0 refills | Status: DC

## 2022-06-12 NOTE — Progress Notes (Signed)
SUBJECTIVE:   Chief Complaint  Patient presents with   Transitions Of Care   HPI Patient presents to clinic to transfer care.  Chronic rash/Mast cell activation syndrome She reports intermittent itchy rash on her upper arms bilaterally that has been there for about 1 year.  Was previously prescribed Lotrisone which she reports helped but continues to have flareups.  Xyzal was increased to 5 mg twice daily.  Started on cromolyn and now takes 200 mg 4 times a day.  She has multiple allergies, no anaphylactic reaction.  Carries EpiPen. Follows with immunologist at [facility].  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    History of asthma Reports exercise-induced.  Has albuterol inhaler for as needed use.   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  Constitutional:      General: She is not in acute distress.    Appearance: She is normal weight. She is not ill-appearing.  HENT:     Head: Normocephalic.  Eyes:     Conjunctiva/sclera: Conjunctivae normal.  Neck:     Thyroid: No thyromegaly or thyroid tenderness.  Cardiovascular:     Rate and Rhythm: Normal rate and regular rhythm.     Pulses: Normal pulses.  Pulmonary:     Effort: Pulmonary effort is normal.     Breath sounds: Normal breath sounds.  Abdominal:     General: Bowel sounds are normal.  Skin:    General: Skin is warm.     Findings: Rash present. Rash is urticarial. Rash is not crusting, purpuric, pustular, scaling or vesicular.  Neurological:     Mental Status: She is alert. Mental status is at  baseline.  Psychiatric:        Mood and Affect: Mood normal.        Behavior: Behavior normal.        Thought Content: Thought content normal.        Judgment: Judgment normal.     ASSESSMENT/PLAN:  Rash and nonspecific skin eruption Assessment & Plan: Chronic.  Differentials include Granuloma Annulare, Cutaneous Vasculitis, Urticaria Multiform.  Low suspicion for Dermatophyte infection given previous antifungal not helpful.   Has not seen Rheumatology Follows with Immunology for MCAS  Will check CRP, ESR and CBC today Start Triamcinolone 0.5% cream BID.   Recommend Dermatology follow up for biopsy Follow up if no improvement  Orders: -     CBC with Differential/Platelet -     Sedimentation rate -     C-reactive protein -     Ferritin -     Triamcinolone Acetonide; Apply topically 2 (two) times daily.  Dispense: 30 g; Refill: 0  Angioedema, subsequent encounter Assessment & Plan: Chronic.  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  Compliant with current medication. Continue Xyzal 5 mg twice daily Continue cromolyn 200 mg 4 times daily Continue Pepcid 40 mg twice daily EpiPen Follow-up with immunology  Orders: -     Cromolyn Sodium; Take 10 mLs (  200 mg total) by mouth 4 (four) times daily. 5 ml 30 minutes prior to each meal and 30 minutes prior to bed. Can start 2 doses daily. Dr. Ellie Lunch  Dispense: 480 mL  Exercise-induced asthma Assessment & Plan: Reports history of wheezing with exercise.  Uses albuterol as needed. Has multiple allergies Follow-up with allergist.   Leukopenia, unspecified type Assessment & Plan: Chronic.  Thought autoimmune nutrition failure Follows with oncology. Will check CBC   HCM Last Pap 03/23.  NILM, HPV negative. Mirena IUD inserted 3/23.  Follows with Dr. Primitivo Gauze, OB/GYN Tetanus up-to-date HIV and hepatitis C screening completed.  PDMP reviewed  Return in about 2  months (around 08/12/2022) for PCP.  Dana Allan, MD

## 2022-06-12 NOTE — Patient Instructions (Addendum)
It was a pleasure meeting you today. Thank you for allowing me to take part in your health care.  Our goals for today as we discussed include:  Labs today  Start Triamcinalone 0.5% cream two times a day for 5 day.  Use thin layer and avoid direct contact with sun.  If you have any questions or concerns, please do not hesitate to call the office at [phone].  I look forward to our next visit and until then take care and stay safe.  Regards,   Dana Allan, MD   [facility]

## 2022-06-13 LAB — CBC WITH DIFFERENTIAL/PLATELET
Basophils Absolute: 0 10*3/uL (ref 0.0–0.1)
Basophils Relative: 0.6 % (ref 0.0–3.0)
Eosinophils Absolute: 0.1 10*3/uL (ref 0.0–0.7)
Eosinophils Relative: 2.8 % (ref 0.0–5.0)
HCT: 35.1 % — ABNORMAL LOW (ref 36.0–46.0)
Hemoglobin: 12.2 g/dL (ref 12.0–15.0)
Lymphocytes Relative: 35.1 % (ref 12.0–46.0)
Lymphs Abs: 1.3 10*3/uL (ref 0.7–4.0)
MCHC: 34.8 g/dL (ref 30.0–36.0)
MCV: 90.3 fl (ref 78.0–100.0)
Monocytes Absolute: 0.3 10*3/uL (ref 0.1–1.0)
Monocytes Relative: 9.7 % (ref 3.0–12.0)
Neutro Abs: 1.9 10*3/uL (ref 1.4–7.7)
Neutrophils Relative %: 51.8 % (ref 43.0–77.0)
Platelets: 166 10*3/uL (ref 150.0–400.0)
RBC: 3.88 Mil/uL (ref 3.87–5.11)
RDW: 13.2 % (ref 11.5–15.5)
WBC: 3.6 10*3/uL — ABNORMAL LOW (ref 4.0–10.5)

## 2022-06-13 LAB — C-REACTIVE PROTEIN: CRP: <1 mg/dL (ref 0.5–20.0)

## 2022-06-13 LAB — SEDIMENTATION RATE: Sed Rate: 6 mm/hr (ref 0–20)

## 2022-06-13 LAB — FERRITIN: Ferritin: 30.3 ng/mL (ref 10.0–291.0)

## 2022-06-17 ENCOUNTER — Encounter: Payer: Self-pay | Admitting: Family Medicine

## 2022-06-17 DIAGNOSIS — J45990 Exercise induced bronchospasm: Secondary | ICD-10-CM | POA: Insufficient documentation

## 2022-06-17 DIAGNOSIS — R21 Rash and other nonspecific skin eruption: Secondary | ICD-10-CM | POA: Insufficient documentation

## 2022-06-17 DIAGNOSIS — D72819 Decreased white blood cell count, unspecified: Secondary | ICD-10-CM | POA: Insufficient documentation

## 2022-06-17 HISTORY — DX: Rash and other nonspecific skin eruption: R21

## 2022-06-17 MED ORDER — CROMOLYN SODIUM 100 MG/5ML PO CONC: 200.0000 mg | Freq: Four times a day (QID) | ORAL | Status: DC

## 2022-06-17 NOTE — Assessment & Plan Note (Signed)
Reports history of wheezing with exercise.  Uses albuterol as needed. Has multiple allergies Follow-up with allergist.

## 2022-06-17 NOTE — Assessment & Plan Note (Signed)
Chronic.  Carries EpiPen Follow-up with immunology

## 2022-06-17 NOTE — Assessment & Plan Note (Signed)
Chronic.  Differentials include Granuloma Annulare, Cutaneous Vasculitis, Urticaria Multiform.  Low suspicion for Dermatophyte infection given previous antifungal not helpful.   Has not seen Rheumatology Follows with Immunology for MCAS  Will check CRP, ESR and CBC today Start Triamcinolone 0.5% cream BID.   Recommend Dermatology follow up for biopsy Follow up if no improvement

## 2022-06-17 NOTE — Assessment & Plan Note (Signed)
Chronic.  Thought autoimmune nutrition failure Follows with oncology. Will check CBC

## 2022-06-17 NOTE — Assessment & Plan Note (Signed)
Chronic.  Compliant with current medication. Continue Xyzal 5 mg twice daily Continue cromolyn 200 mg 4 times daily Continue Pepcid 40 mg twice daily EpiPen Follow-up with immunology

## 2022-08-02 DIAGNOSIS — D4709 Other mast cell neoplasms of uncertain behavior: Secondary | ICD-10-CM | POA: Diagnosis not present

## 2022-08-14 ENCOUNTER — Encounter: Payer: Self-pay | Admitting: Family Medicine

## 2022-08-14 ENCOUNTER — Ambulatory Visit: Payer: 59 | Admitting: Family Medicine

## 2022-08-14 VITALS — BP 118/68 | HR 78 | Temp 98.1°F | Resp 16 | Ht 67.0 in | Wt 132.2 lb

## 2022-08-14 DIAGNOSIS — D708 Other neutropenia: Secondary | ICD-10-CM

## 2022-08-14 DIAGNOSIS — D72819 Decreased white blood cell count, unspecified: Secondary | ICD-10-CM

## 2022-08-14 LAB — CBC
HCT: 39.2 % (ref 36.0–46.0)
Hemoglobin: 13.1 g/dL (ref 12.0–15.0)
MCHC: 33.3 g/dL (ref 30.0–36.0)
MCV: 92.6 fl (ref 78.0–100.0)
Platelets: 180 10*3/uL (ref 150.0–400.0)
RBC: 4.23 Mil/uL (ref 3.87–5.11)
RDW: 12.6 % (ref 11.5–15.5)
WBC: 2.9 10*3/uL — ABNORMAL LOW (ref 4.0–10.5)

## 2022-08-14 NOTE — Patient Instructions (Addendum)
It was a pleasure seeing you today. Thank you for allowing me to take part in your health care.  Our goals for today as we discussed include:  Follow up with Immunology as scheduled  Follow up with hematology  Will check CBC today   If you have any questions or concerns, please do not hesitate to call the office at [phone].  I look forward to our next visit and until then take care and stay safe.  Regards,   Dana Allan, MD   [facility]

## 2022-08-14 NOTE — Progress Notes (Signed)
   SUBJECTIVE:   Chief Complaint  Patient presents with   Medical Management of Chronic Issues   HPI Patient presents to clinic for follow-up chronic disease management.  Chronic rash/Mast cell activation syndrome Doing well on current medication.  Follows with Pulmonology  Neutropenia She was recently seen by her immunologist who had requested repeat CBC for low white count.   Summary of Leukopenia  She has been consistently having low WBCs since 2023.  Baseline between 3-3.1.  Has been as low as 2.3 in 2023  and more recently as high as 3.6 on 06/2022   Immunology repeated CBC on 05/29 and WBC 2.6 at that time.  Was advised to have repeated as she was concerned for other underlying etiology.   She was previously evaluated by hematology and no indication for BM biopsy at that time.  Was thought to be related to autoimmune neutrophilia however repeat neutrophil antibody negative.   PERTINENT PMH / PSH: Asthma, exercise-induced MCAS History of angioedema History of positive antineutrophil antibody History of amniotic fluid embolism History of mild-mod  tricuspid regurgitation History of mild mitral valve regurgitation   OBJECTIVE:  BP 118/68   Pulse 78   Temp 98.1 F (36.7 C)   Resp 16   Ht 5\' 7"  (1.702 m)   Wt 132 lb 4 oz (60 kg)   SpO2 99%   Breastfeeding No   BMI 20.71 kg/m    Physical Exam Constitutional:      General: She is not in acute distress.    Appearance: She is normal weight. She is not ill-appearing.  HENT:     Head: Normocephalic.  Eyes:     Conjunctiva/sclera: Conjunctivae normal.  Cardiovascular:     Rate and Rhythm: Normal rate and regular rhythm.     Pulses: Normal pulses.  Pulmonary:     Effort: Pulmonary effort is normal.     Breath sounds: Normal breath sounds.  Abdominal:     General: Bowel sounds are normal.  Neurological:     Mental Status: She is alert. Mental status is at baseline.  Psychiatric:        Mood and Affect: Mood  normal.        Behavior: Behavior normal.        Thought Content: Thought content normal.        Judgment: Judgment normal.     ASSESSMENT/PLAN:  Leukopenia, unspecified type Assessment & Plan: Chronic.  Thought autoimmune nutrition failure however recheck of neutrophil antibody negative.  Remains asymptomatic.  Considering pregnancy in future and would like to know if this will have any complications during pregnancy or delivery.  Reports initially started with Fat embolism during last pregnancy. Curbsided Hematology for recommendations at this time. No indication for BM biopsy and if continues to be asymptomatic she may continue to have low WBC count.  Dr Orlie Dakin suggested if Immunology concerned can trial dose ofsteroid taper pack if there is concern for not increasing.   Will check CBC today. If increasing will defer management to Immunology. Follow up with Hematology Follow up with Immunology Could consider Rheumatology referral in future  Orders: -     CBC  Autoimmune neutropenia (HCC)  HCM Last Pap 03/23.  NILM, HPV negative. Mirena IUD inserted 3/23.  Follows with Dr. Primitivo Gauze, OB/GYN Tetanus up-to-date HIV and hepatitis C screening completed.  PDMP reviewed  No follow-ups on file.  Dana Allan, MD

## 2022-08-20 ENCOUNTER — Encounter: Payer: Self-pay | Admitting: Family Medicine

## 2022-08-20 NOTE — Assessment & Plan Note (Addendum)
Chronic.  Thought autoimmune nutrition failure however recheck of neutrophil antibody negative.  Remains asymptomatic.  Considering pregnancy in future and would like to know if this will have any complications during pregnancy or delivery.  Reports initially started with Fat embolism during last pregnancy. Curbsided Hematology for recommendations at this time. No indication for BM biopsy and if continues to be asymptomatic she may continue to have low WBC count.  Dr Orlie Dakin suggested if Immunology concerned can trial dose ofsteroid taper pack if there is concern for not increasing.   Will check CBC today. If increasing will defer management to Immunology. Follow up with Hematology Follow up with Immunology Could consider Rheumatology referral in future

## 2022-11-29 DIAGNOSIS — D8940 Mast cell activation, unspecified: Secondary | ICD-10-CM | POA: Diagnosis not present

## 2022-11-29 DIAGNOSIS — I369 Nonrheumatic tricuspid valve disorder, unspecified: Secondary | ICD-10-CM | POA: Diagnosis not present

## 2022-11-29 DIAGNOSIS — M6208 Separation of muscle (nontraumatic), other site: Secondary | ICD-10-CM | POA: Diagnosis not present

## 2022-11-29 DIAGNOSIS — Z9889 Other specified postprocedural states: Secondary | ICD-10-CM | POA: Diagnosis not present

## 2022-11-29 DIAGNOSIS — D729 Disorder of white blood cells, unspecified: Secondary | ICD-10-CM | POA: Diagnosis not present

## 2022-11-29 DIAGNOSIS — Z309 Encounter for contraceptive management, unspecified: Secondary | ICD-10-CM | POA: Diagnosis not present

## 2022-12-04 ENCOUNTER — Telehealth: Payer: Self-pay

## 2023-01-02 ENCOUNTER — Other Ambulatory Visit: Payer: Self-pay

## 2023-01-02 ENCOUNTER — Ambulatory Visit: Payer: 59 | Attending: Obstetrics and Gynecology | Admitting: Obstetrics and Gynecology

## 2023-01-02 ENCOUNTER — Ambulatory Visit: Payer: 59 | Admitting: *Deleted

## 2023-01-02 DIAGNOSIS — Z3169 Encounter for other general counseling and advice on procreation: Secondary | ICD-10-CM

## 2023-01-02 DIAGNOSIS — D8940 Mast cell activation, unspecified: Secondary | ICD-10-CM

## 2023-01-02 DIAGNOSIS — Z98891 History of uterine scar from previous surgery: Secondary | ICD-10-CM | POA: Diagnosis not present

## 2023-01-02 DIAGNOSIS — O09299 Supervision of pregnancy with other poor reproductive or obstetric history, unspecified trimester: Secondary | ICD-10-CM

## 2023-01-02 NOTE — Progress Notes (Signed)
Pre conception consult  BP:  117 76 Pulse: 63

## 2023-01-02 NOTE — Progress Notes (Signed)
Maternal-Fetal Medicine   Name: Alison Harrison DOB: Sep 13, 1990 [id number] Referring Provider: Belva Agee, MD  I had the pleasure of seeing Alison Harrison [facility] at [facility]. She is G4 [id number] and is here for preconception consultation. Her problems include: -History of amniotic fluid embolism (AFE). -Mast Cell Activation Syndrome (MCAS). -Previous cesarean delivery.  Obstetric history -01/2019: Term vaginal delivery of a female infant weighing 7 pounds and 11 ounces at birth.  She had induction of labor at [redacted] weeks gestation.  Her pregnancy was uncomplicated and her son is in good health. -09/2020: Her second pregnancy was complicated by COVID-19 infection about 3 weeks before induction of labor at [redacted] weeks gestation.  Patient reports she had received COVID-19 vaccination before infection. She also gives history of being stung by a hornet about a week or less before induction of labor. She had tightness of jaw and facial swelling.  During early labor, patient has severe hypotension (80/40 mmHg) and bradycardia (30s/minute).  She had nonreassuring fetal heart rates.  Urgent CT scan without pulmonary embolism.  Because of suspicion of amniotic fluid embolism, she underwent emergency cesarean delivery.  She delivered a female infant weighing 7 pounds and 6 ounces at birth.  Her son is in good health. Postoperatively, she had complete recovery and her vitals returned to normal.  No coagulopathy followed delivery.  Placental studies showed a small adherent clot but otherwise unremarkable. Patient had cardiology appointment in January 2023 and maternal echocardiography was normal with good ejection fraction (55% to 60%). She had 2 early spontaneous miscarriages.   GYN history: No history of abnormal Pap smears or cervical surgeries.  No history of breast disease.  She has a Mirena IUD.  Past medical history is significant for mast cell activation syndrome. About a month after  delivery of her second child, patient had symptoms of tightness of chest, hives all over the body, facial swelling, abdominal cramps, and vomiting. She was seen by an allergy specialist who referred her to Dr. Cherylann Harrison at [facility]. A diagnosis of MCAS was made on clinical symptoms. Triptase levels were within normal range. Leukotriene E4 levels were increased (?).  After the diagnosis, the patient has been taking cromolyn and levocetrizine and had remarkable improvement in her symptoms.  She does not have hypertension or diabetes or any other chronic medical conditions. Patient has leukopenia, possibly, autoimmune in origin. She is being followed by her hematologist and bone marrow biopsy was not advised.  PSH: Cesarean section, wisdom tooth surgery. Medications: Cromolyn solution, levocetirizine (Xyzal), famotidine (Pepcid), albuterol as needed.  Epinephrine as needed.  Mirena IUD. Allergies: Tetracycline (hives). Social history: Denies tobacco or drug or alcohol use.  She is a Manufacturing systems engineer.  She has been married 7 years and the husband has type 1 diabetes.  He is Caucasian. Family history: Mother has hypertension and hypothyroidism.  No history of venous thromboembolism in the family.  No history of MCAS in the family.  Our concerns include: History of amniotic fluid embolism (AFE) AFE reoccurs in about 4 per 100,000 live births.  It is associated with a significant increase in maternal mortality rate from 15% to 40%.  Her history of sudden onset of hypotension and bradycardia in the absence of pulmonary embolism is more likely to be consistent with AFE.  Fortunately, she had good recovery after cesarean delivery.  Patient did not have cardiomyopathy or heart failure.  Follow-up echocardiography 6 months later showed no structural heart defects.  Case reports show  no recurrence of AFE in women who had amniotic fluid embolisms in the previous pregnancies.   Cause of AFE is not known and most (70%)  occur during labor and the rest after delivery. Autopsy results always did not show fetal cells leading to obstruction in maternal pulmonary circulation to cause AFE.   Mast cell degranulation and release of histamine, tryptase and kinins have been shown to occur in some cases and is thought to lead to AFE.   Patient had COVID-19 infection 3 weeks before induction of labor and did not have active infection during labor.  It is difficult to determine whether the unidentified MCAS or COVID-19 infection would have triggered inflammatory response leading to AFE.  Mast Cell Activation Syndrome Mast cells play important role in immunity. They are ubiquitous and present in skin, respiratory tract, and vessel walls. They are also found in the placenta. MCAS is a condition in which there is inappropriate and excess production of inflammatory mediators.  Very little is known about its effects on pregnancy. It is unrecognized in most cases. Treatment with H1 and H2 blockers are safe in pregnancy. If acute flares occur, they can be magaed with increasing dosage of H1/H2 blockers or steroids.  If MCAS is stable without flares, we should expect good pregnancy outcomes. A slight increase in spontaneous miscarriages is reported. Preeclampsia is probably increased. Low-dose aspirin is beneficial. Postpartum "blues" and depression are more likely.  Preconception I encouraged the patient to take folic acid (400 micrograms daily) or continue prenatal vitamins. Preconception folic acid decreases the likely would have open spina bifida.  Previous cesarean delivery Patient is 5 feet 6 inches tall and this is a good candidate for VBAC.  I briefly counseled her on the risk and benefits of VBAC.  The risk of uterine scar rupture is about 1%.  I counseled her that cesarean delivery does not decrease the likelihood of amniotic fluid embolism.  Repeat cesarean deliveries also increase the risk of placenta previa and/or  placenta accreta spectrum.  Overall, the risk for recurrence of amniotic fluid embolism is extremely low.  Provided MCAS is stable with antihistamines, we should expect good pregnancy outcomes.  Recommendations -Continue follow-up with her immunologist at [facility]. -Continue preconception prenatal vitamins (folic acid). -[facility] consultation at early pregnancy. -Follow-up MFM consultation in early pregnancy. -Continue follow-up with her hematologist (leukopenia).  Thank you for consultation.  If you have any questions or concerns, please contact me the [facility].  Consultation including face-to-face counseling 50 minutes.

## 2023-01-10 DIAGNOSIS — Z23 Encounter for immunization: Secondary | ICD-10-CM | POA: Diagnosis not present

## 2023-03-12 ENCOUNTER — Telehealth: Payer: Self-pay | Admitting: Oncology

## 2023-03-12 ENCOUNTER — Other Ambulatory Visit: Payer: Self-pay | Admitting: *Deleted

## 2023-03-12 DIAGNOSIS — D72819 Decreased white blood cell count, unspecified: Secondary | ICD-10-CM

## 2023-03-12 NOTE — Telephone Encounter (Signed)
 Pt called and wants to make an MD appt.   Pt stated she wants to get pregnant and is concerned about low count of white blood cells and wants to discuss possible bone marrow biopsy.   Phone number on pt chart is correct. Please advise and then call pt with update

## 2023-03-20 ENCOUNTER — Inpatient Hospital Stay: Payer: 59 | Attending: Oncology

## 2023-03-20 ENCOUNTER — Inpatient Hospital Stay (HOSPITAL_BASED_OUTPATIENT_CLINIC_OR_DEPARTMENT_OTHER): Payer: 59 | Admitting: Oncology

## 2023-03-20 ENCOUNTER — Encounter: Payer: Self-pay | Admitting: Oncology

## 2023-03-20 VITALS — BP 122/75 | HR 80 | Temp 96.6°F | Resp 20 | Wt 136.1 lb

## 2023-03-20 DIAGNOSIS — D72819 Decreased white blood cell count, unspecified: Secondary | ICD-10-CM

## 2023-03-20 DIAGNOSIS — D649 Anemia, unspecified: Secondary | ICD-10-CM | POA: Diagnosis not present

## 2023-03-20 DIAGNOSIS — Z862 Personal history of diseases of the blood and blood-forming organs and certain disorders involving the immune mechanism: Secondary | ICD-10-CM | POA: Diagnosis not present

## 2023-03-20 LAB — CBC WITH DIFFERENTIAL/PLATELET
Abs Immature Granulocytes: 0.01 10*3/uL (ref 0.00–0.07)
Basophils Absolute: 0 10*3/uL (ref 0.0–0.1)
Basophils Relative: 1 %
Eosinophils Absolute: 0.1 10*3/uL (ref 0.0–0.5)
Eosinophils Relative: 3 %
HCT: 35.6 % — ABNORMAL LOW (ref 36.0–46.0)
Hemoglobin: 11.7 g/dL — ABNORMAL LOW (ref 12.0–15.0)
Immature Granulocytes: 0 %
Lymphocytes Relative: 31 %
Lymphs Abs: 1.4 10*3/uL (ref 0.7–4.0)
MCH: 30.5 pg (ref 26.0–34.0)
MCHC: 32.9 g/dL (ref 30.0–36.0)
MCV: 93 fL (ref 80.0–100.0)
Monocytes Absolute: 0.4 10*3/uL (ref 0.1–1.0)
Monocytes Relative: 8 %
Neutro Abs: 2.6 10*3/uL (ref 1.7–7.7)
Neutrophils Relative %: 57 %
Platelets: 188 10*3/uL (ref 150–400)
RBC: 3.83 MIL/uL — ABNORMAL LOW (ref 3.87–5.11)
RDW: 11.9 % (ref 11.5–15.5)
WBC: 4.6 10*3/uL (ref 4.0–10.5)
nRBC: 0 % (ref 0.0–0.2)

## 2023-03-20 NOTE — Progress Notes (Signed)
 [facility]  [phone] [phone]  ID: Alison Harrison OB: 1990/05/08  MR#: [id number]  RDW#:[id number]  Patient Care Team: Hope Merle, MD as PCP - General (Family Medicine) Marne Kelly Nest, MD as PCP - OBGYN (Obstetrics and Gynecology)   CHIEF COMPLAINT: Autoimmune neutropenia.  INTERVAL HISTORY: Patient returns to clinic today as an add-on for discussion of her neutropenia and whether or not a bone marrow biopsy is necessary.  She is planning on attempting to get pregnant in the near future. She continues to feel well and remains asymptomatic. She has no neurologic complaints.  She denies any recent fevers or illnesses.  She has a good appetite and denies weight loss.  She has no chest pain, shortness of breath, cough, or hemoptysis.  She denies any nausea, vomiting, constipation, or diarrhea.  She has no urinary complaints.  Patient offers no specific complaints today.  REVIEW OF SYSTEMS:   Review of Systems  Constitutional: Negative.  Negative for fever, malaise/fatigue and weight loss.  Respiratory: Negative.  Negative for cough, hemoptysis and shortness of breath.   Cardiovascular: Negative.  Negative for chest pain and leg swelling.  Gastrointestinal:  Negative for abdominal pain.  Genitourinary: Negative.  Negative for dysuria.  Musculoskeletal: Negative.  Negative for back pain.  Skin: Negative.  Negative for rash.  Neurological: Negative.  Negative for dizziness, focal weakness, weakness and headaches.  Psychiatric/Behavioral: Negative.  The patient is not nervous/anxious.     As per HPI. Otherwise, a complete review of systems is negative.  PAST MEDICAL HISTORY: Past Medical History:  Diagnosis Date   Abrasion of wrist, right    right wrist sting wray laceration right wrist   Allergy    Chicken pox    COVID-19    10/2020, 11/2020   Exercise-induced asthma    GERD (gastroesophageal reflux disease)    Kidney  stones    Pneumonia 06/2017   Tick bite    Tricuspid regurgitation     PAST SURGICAL HISTORY: Past Surgical History:  Procedure Laterality Date   CESAREAN SECTION N/A 09/23/2020   Procedure: CESAREAN SECTION;  Surgeon: Marne Kelly Nest, MD;  Location: [facility] LD ORS;  Service: Obstetrics;  Laterality: N/A;   WISDOM TOOTH EXTRACTION      FAMILY HISTORY: Family History  Problem Relation Age of Onset   Hypertension Mother    Arthritis Maternal Grandmother    Alcohol abuse Maternal Grandfather    Diabetes Maternal Grandfather    Cancer Paternal Grandmother        breast    Cancer Paternal Grandfather        prostate    Miscarriages / Stillbirths Sister     ADVANCED DIRECTIVES (Y/N):  N  HEALTH MAINTENANCE: Social History   Tobacco Use   Smoking status: Never   Smokeless tobacco: Never  Vaping Use   Vaping status: Never Used  Substance Use Topics   Alcohol use: Not Currently   Drug use: Never    Comment: husband      Colonoscopy:  PAP:  Bone density:  Lipid panel:  Allergies  Allergen Reactions   Egg-Derived Products Hives   Milk-Related Compounds Anaphylaxis    Stomach upset + hives   Tetracycline Hives    Sob   Other Swelling    Peanuts    Current Outpatient Medications  Medication Sig Dispense Refill   albuterol (VENTOLIN HFA) 108 (90 Base) MCG/ACT inhaler Inhale 2 puffs into the lungs as needed.  cromolyn  (GASTROCROM ) 100 MG/5ML solution Take 10 mLs (200 mg total) by mouth 4 (four) times daily. 5 ml 30 minutes prior to each meal and 30 minutes prior to bed. Can start 2 doses daily. Dr. Lugar 480 mL    famotidine  (PEPCID ) 40 MG tablet Take 1 tablet (40 mg total) by mouth 2 (two) times daily. 180 tablet 3   ketoconazole (NIZORAL) 2 % shampoo SHAMPOO SCALP 3 TIMES WEEKLY AS MAINTENANCE     levocetirizine (XYZAL ) 5 MG tablet Take 1 tablet (5 mg total) by mouth in the morning and at bedtime. 180 tablet 3   levonorgestrel  (MIRENA ) 20 MCG/DAY IUD 1 each by  Intrauterine route once.     EPINEPHrine  0.3 mg/0.3 mL IJ SOAJ injection Inject 0.3 mg into the muscle as needed for anaphylaxis. (Patient not taking: Reported on 03/20/2023) 1 each 5   triamcinolone  cream (KENALOG ) 0.5 % Apply topically 2 (two) times daily. (Patient not taking: Reported on 03/20/2023) 30 g 0   No current facility-administered medications for this visit.    OBJECTIVE: Vitals:   03/20/23 1419  BP: 122/75  Pulse: 80  Resp: 20  Temp: (!) 96.6 F (35.9 C)  SpO2: 100%      Body mass index is 21.32 kg/m.    ECOG FS:0 - Asymptomatic  General: Well-developed, well-nourished, no acute distress. Eyes: Pink conjunctiva, anicteric sclera. HEENT: Normocephalic, moist mucous membranes. Lungs: No audible wheezing or coughing. Heart: Regular rate and rhythm. Abdomen: Soft, nontender, no obvious distention. Musculoskeletal: No edema, cyanosis, or clubbing. Neuro: Alert, answering all questions appropriately. Cranial nerves grossly intact. Skin: No rashes or petechiae noted. Psych: Normal affect.   LAB RESULTS:  Lab Results  Component Value Date   NA 141 12/12/2021   K 4.0 12/12/2021   CL 106 12/12/2021   CO2 28 12/12/2021   GLUCOSE 81 12/12/2021   BUN 13 12/12/2021   CREATININE 0.71 12/12/2021   CALCIUM 9.1 12/12/2021   PROT 6.5 12/12/2021   ALBUMIN 4.3 12/12/2021   AST 16 12/12/2021   ALT 15 12/12/2021   ALKPHOS 48 12/12/2021   BILITOT 0.4 12/12/2021   GFRNONAA >60 09/26/2020   GFRAA >60 01/17/2019    Lab Results  Component Value Date   WBC 4.6 03/20/2023   NEUTROABS 2.6 03/20/2023   HGB 11.7 (L) 03/20/2023   HCT 35.6 (L) 03/20/2023   MCV 93.0 03/20/2023   PLT 188 03/20/2023     STUDIES: No results found.  ASSESSMENT: Autoimmune neutropenia.  PLAN:    Autoimmune neutropenia: Resolved.  Patient's total white blood cell count is within normal limits today.  She was previously noted to have positive neutrophil antibodies, but repeat testing on May 15, 2022 was negative.  These were likely transient in nature or a false positive secondary to her underlying mast cell activation syndrome.  Previously, all of her other laboratory work was either negative or within normal limits.  No intervention is needed at this time.  Patient does not require treatment or a bone marrow biopsy.  No further follow-up has been scheduled at this time, but patient will call clinic at which time she becomes pregnant for monitoring during her pregnancy.  Pregnancy: No obvious increased risk of infection from a hematology standpoint during pregnancy.     Anemia: Mild.  Patient's hemoglobin is 11.7. Thrombocytopenia: Resolved.   Patient expressed understanding and was in agreement with this plan. She also understands that She can call clinic at any time with any questions, concerns,  or complaints.    Alison JINNY Reusing, MD   03/20/2023 2:27 PM

## 2023-04-05 ENCOUNTER — Ambulatory Visit: Payer: 59 | Admitting: Internal Medicine

## 2023-04-05 IMAGING — CT CT ANGIO CHEST
2 of 6 series · 19 of 36 positions shown · IV contrast (omnipaque)
Comparison: None available.

CLINICAL DATA: PE suspected, high prob

Shortness of breath.
EXAM:
CT ANGIOGRAPHY CHEST WITH CONTRAST
TECHNIQUE: Multidetector CT imaging of the chest was performed using the
standard protocol during bolus administration of intravenous
contrast. Multiplanar CT image reconstructions and MIPs were
obtained to evaluate the vascular anatomy.
CONTRAST:  75mL OMNIPAQUE IOHEXOL 350 MG/ML SOLN

[Series 7: pe thins · axial · 0.88mm/px · z∈[-782,-517]mm · 18 of 423 slices shown]
[im 22/423  lung]
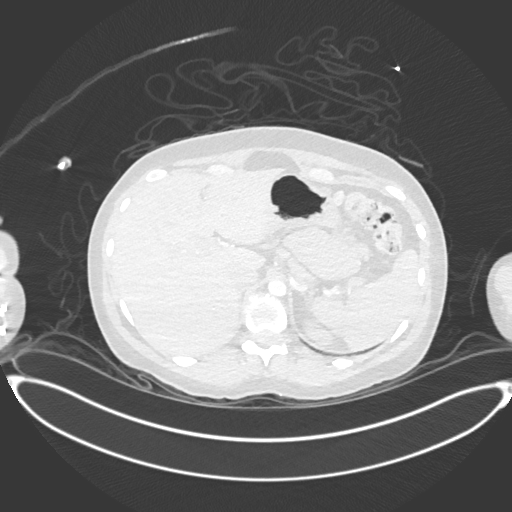
[im 43/423  mediastinal]
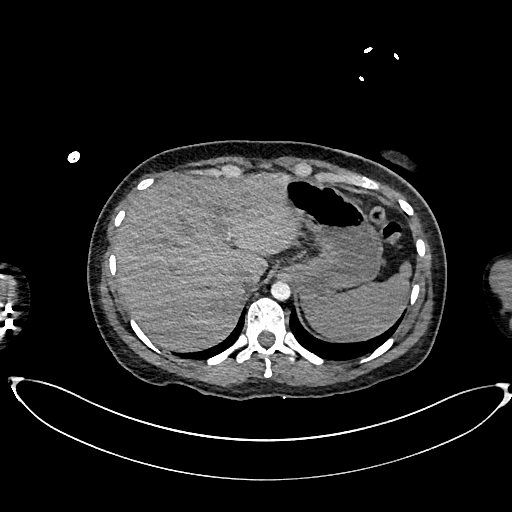
[im 64/423  lung]
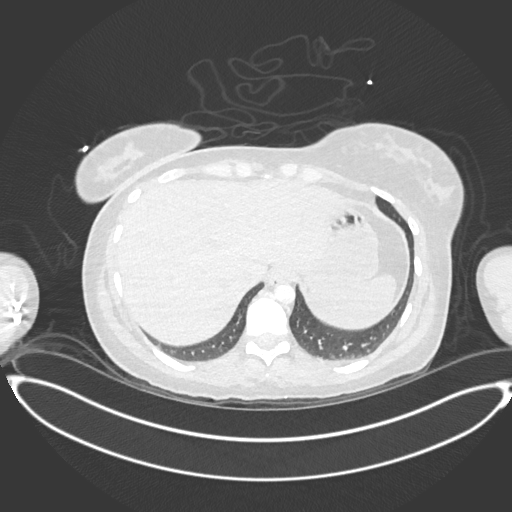
[im 85/423  mediastinal]
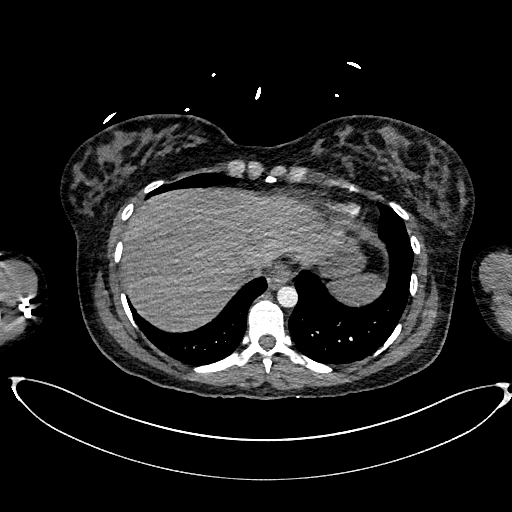
[im 106/423  lung]
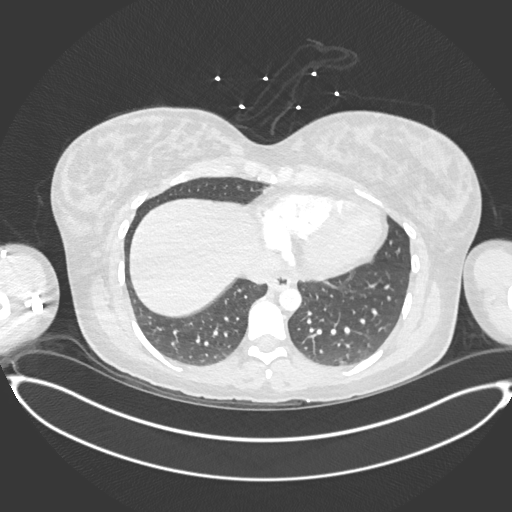
[im 127/423  mediastinal]
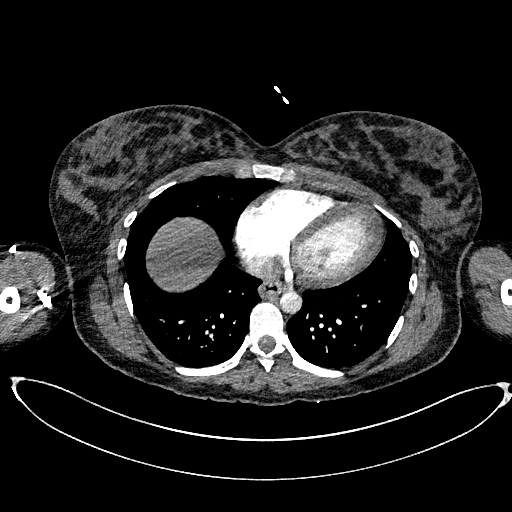
[im 148/423  lung]
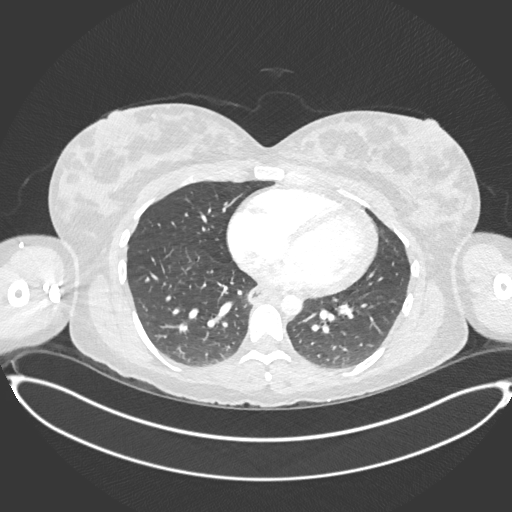
[im 169/423  mediastinal]
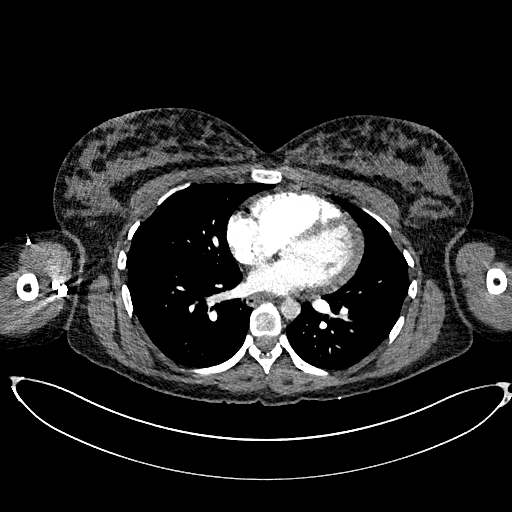
[im 190/423  lung]
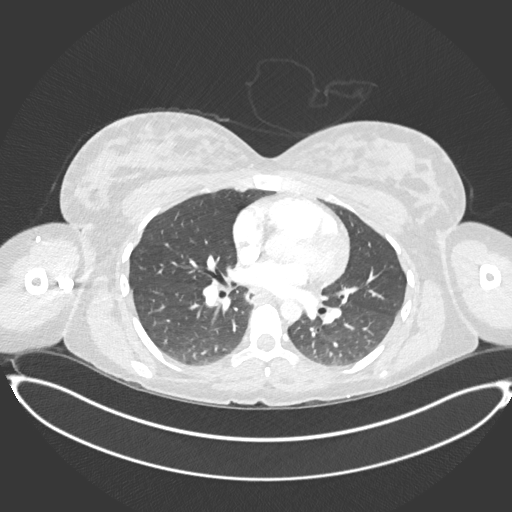
[im 233/423  mediastinal]
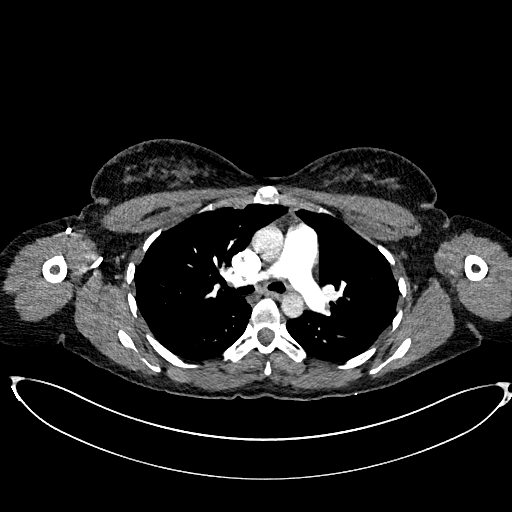
[im 254/423  lung]
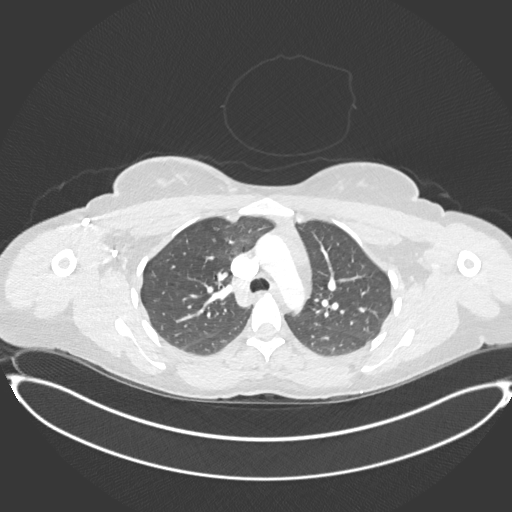
[im 275/423  mediastinal]
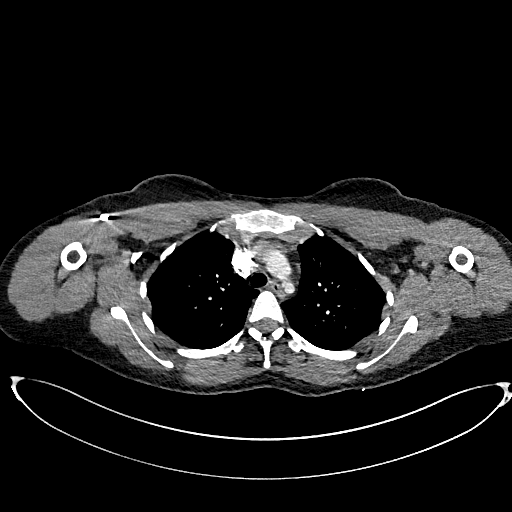
[im 296/423  lung]
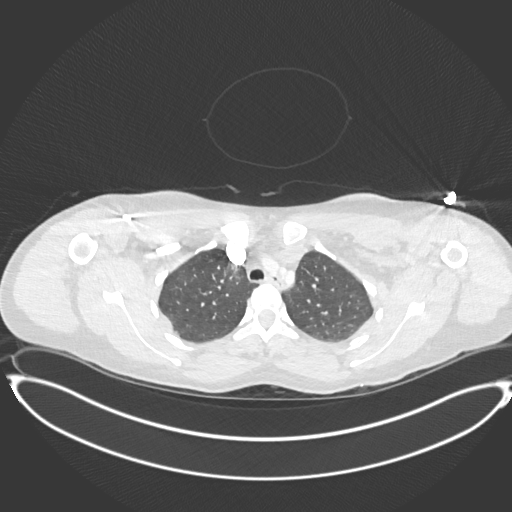
[im 317/423  mediastinal]
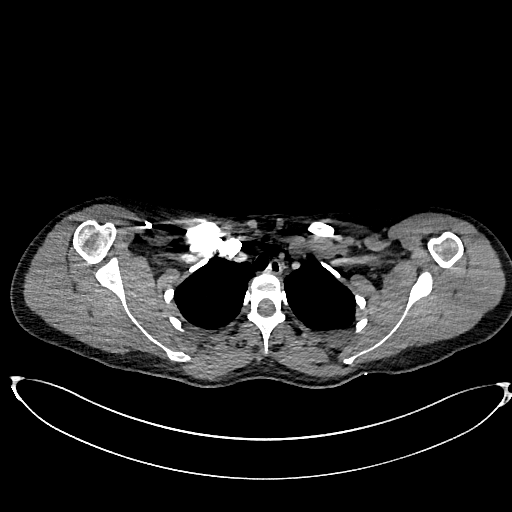
[im 338/423  lung]
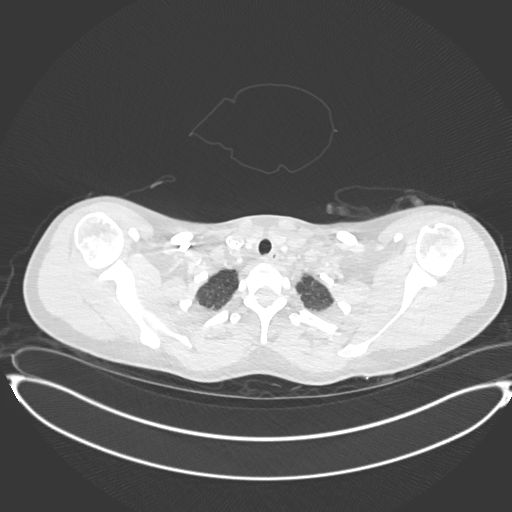
[im 359/423  mediastinal]
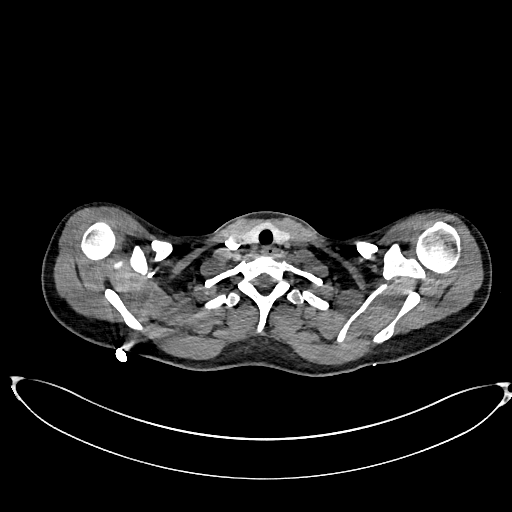
[im 380/423  lung]
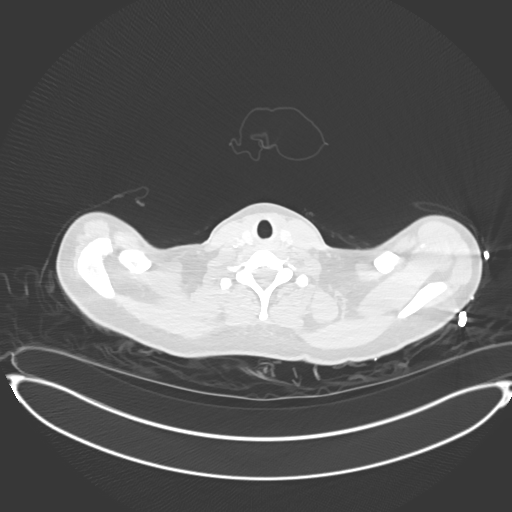
[im 401/423  mediastinal]
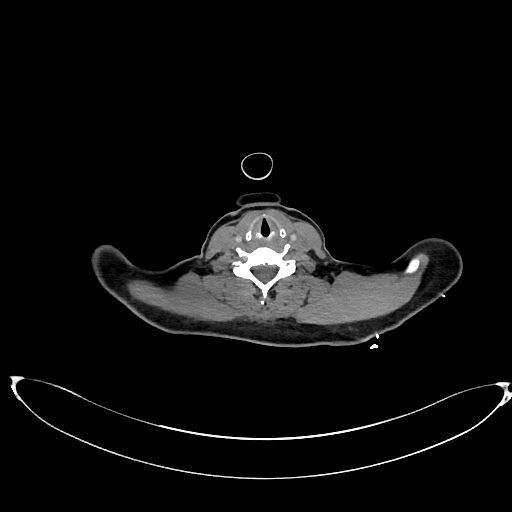

[Series 8: pe 2mm cor · coronal · 0.59mm/px · 1 of 115 slices shown]
[im 58/115  mediastinal]
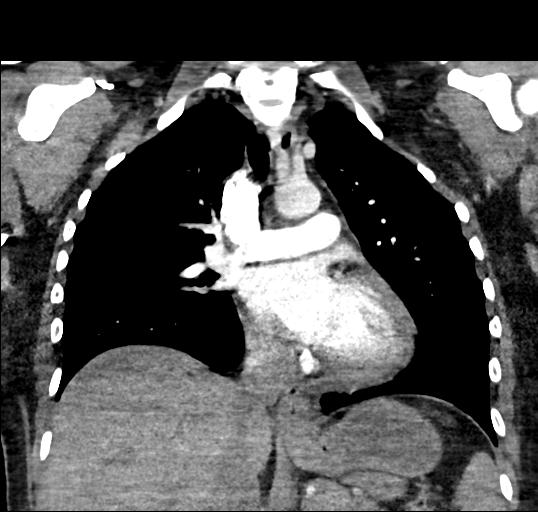

[19 of 36 positions shown; findings below may reference images not displayed]

FINDINGS: Cardiovascular: There are no filling defects within the pulmonary
arteries to suggest pulmonary embolus. Normal heart size. Normal
caliber thoracic aorta without dissection or acute aortic findings.
Left vertebral artery arises directly from the thoracic aorta no
pericardial effusion.

Mediastinum/Nodes: No mediastinal or hilar adenopathy. Distal
esophagus is minimally patulous. No esophageal wall thickening. No
thyroid nodule.

Lungs/Pleura: Clear lungs. No acute or focal airspace disease. No
findings of pulmonary edema. No pleural effusion trachea and central
bronchi are patent.

Upper Abdomen: No acute findings.

Musculoskeletal: There are no acute or suspicious osseous
abnormalities.

Review of the MIP images confirms the above findings.
IMPRESSION: No pulmonary embolus or acute intrathoracic abnormality.

## 2023-04-16 DIAGNOSIS — Z30432 Encounter for removal of intrauterine contraceptive device: Secondary | ICD-10-CM | POA: Diagnosis not present

## 2023-04-19 ENCOUNTER — Encounter: Payer: Self-pay | Admitting: Family Medicine

## 2023-04-19 ENCOUNTER — Ambulatory Visit: Payer: 59 | Admitting: Family Medicine

## 2023-04-19 VITALS — BP 116/70 | HR 64 | Temp 98.0°F | Resp 18 | Ht 67.0 in | Wt 135.0 lb

## 2023-04-19 DIAGNOSIS — Z91012 Allergy to eggs: Secondary | ICD-10-CM | POA: Diagnosis not present

## 2023-04-19 DIAGNOSIS — H6993 Unspecified Eustachian tube disorder, bilateral: Secondary | ICD-10-CM | POA: Diagnosis not present

## 2023-04-19 DIAGNOSIS — Z91011 Allergy to milk products: Secondary | ICD-10-CM

## 2023-04-19 DIAGNOSIS — D708 Other neutropenia: Secondary | ICD-10-CM | POA: Diagnosis not present

## 2023-04-19 MED ORDER — EPINEPHRINE 0.3 MG/0.3ML IJ SOAJ
0.3000 mg | INTRAMUSCULAR | 5 refills | Status: AC | PRN
Start: 2023-04-19 — End: ?

## 2023-04-19 MED ORDER — AMOXICILLIN-POT CLAVULANATE 875-125 MG PO TABS
1.0000 | ORAL_TABLET | Freq: Two times a day (BID) | ORAL | 0 refills | Status: AC
Start: 2023-04-19 — End: 2023-04-24

## 2023-04-19 MED ORDER — FLUTICASONE PROPIONATE 50 MCG/ACT NA SUSP
2.0000 | Freq: Two times a day (BID) | NASAL | 6 refills | Status: AC
Start: 2023-04-19 — End: ?

## 2023-04-19 MED ORDER — AZELASTINE HCL 0.1 % NA SOLN
2.0000 | Freq: Two times a day (BID) | NASAL | 12 refills | Status: AC
Start: 2023-04-19 — End: ?

## 2023-04-19 MED ORDER — SACCHAROMYCES BOULARDII 250 MG PO CAPS: 250.0000 mg | ORAL_CAPSULE | Freq: Every day | ORAL | 0 refills | Status: DC

## 2023-04-19 NOTE — Patient Instructions (Addendum)
It was a pleasure meeting you today. Thank you for allowing me to take part in your health care.  Our goals for today as we discussed include:  Start Flonase and Astelin nasal spray as directed Start Augmentin  Start Probiotics daily and continue for 14 days after completion of antibiotics  If no improvement follow up with MD   This is a list of the screening recommended for you and due dates:  Health Maintenance  Topic Date Due   COVID-19 Vaccine (1) Never done   Pneumococcal Vaccination (1 of 2 - PCV) Never done   HPV Vaccine (2 - 3-dose series) 10/07/2007   Flu Shot  05/02/2023*   Pap with HPV screening  05/18/2024   DTaP/Tdap/Td vaccine (9 - Td or Tdap) 06/29/2030   Hepatitis C Screening  Completed   HIV Screening  Completed  *Topic was postponed. The date shown is not the original due date.     If you have any questions or concerns, please do not hesitate to call the office at [phone].  I look forward to our next visit and until then take care and stay safe.  Regards,   Dana Allan, MD   [facility]

## 2023-04-19 NOTE — Progress Notes (Signed)
SUBJECTIVE:   Chief Complaint  Patient presents with   Ear Pain    Left ear since last week. Pain come and goes   HPI Presents for acute visit  Discussed the use of AI scribe software for clinical note transcription with the patient, who gave verbal consent to proceed.  History of Present Illness Alison Harrison is a 33 year old female with mast cell issues who presents with ear pain.  She has been experiencing sharp, intermittent ear pain since last Friday night. The pain initially subsided over the weekend but returned intermittently, with significant discomfort noted yesterday. It is described as 'coming and going' and was particularly bothersome this morning. There is no fever, decrease in hearing, or ear discharge, but she reports a sensation of congestion in the ear, described as feeling like 'there's something kind of like in there.'  Last week, she experienced facial congestion, initially thought to be a sinus infection, and took Sudafed, which helped alleviate the facial congestion, but the ear pain persisted. Tylenol taken for the ear pain yesterday did not provide significant relief. She has not used Flonase or any other nasal sprays recently.  Her past medical history includes mast cell issues, which flared up last week, causing inflammation and other symptoms. She has a history of ear infections during childhood but has not experienced them as an adult.      PERTINENT PMH / PSH: As above  OBJECTIVE:  BP 116/70   Pulse 64   Temp 98 F (36.7 C)   Resp 18   Ht 5\' 7"  (1.702 m)   Wt 135 lb (61.2 kg)   SpO2 99%   BMI 21.14 kg/m    Physical Exam Vitals reviewed.  Constitutional:      General: She is not in acute distress.    Appearance: Normal appearance. She is normal weight. She is not ill-appearing, toxic-appearing or diaphoretic.  HENT:     Right Ear: No decreased hearing noted. A middle ear effusion is present.     Left Ear: No decreased hearing  noted. Tenderness present. A middle ear effusion is present.     Nose: Rhinorrhea present.  Eyes:     General:        Right eye: No discharge.        Left eye: No discharge.     Conjunctiva/sclera: Conjunctivae normal.  Cardiovascular:     Rate and Rhythm: Normal rate and regular rhythm.     Heart sounds: Normal heart sounds.  Pulmonary:     Effort: Pulmonary effort is normal.     Breath sounds: Normal breath sounds.  Musculoskeletal:        General: Normal range of motion.  Skin:    General: Skin is warm and dry.  Neurological:     General: No focal deficit present.     Mental Status: She is alert and oriented to person, place, and time. Mental status is at baseline.  Psychiatric:        Mood and Affect: Mood normal.        Behavior: Behavior normal.        Thought Content: Thought content normal.        Judgment: Judgment normal.           04/19/2023    2:51 PM 08/14/2022    8:35 AM 06/12/2022    3:11 PM 12/09/2021    8:34 AM 11/03/2021   10:51 AM  Depression screen PHQ 2/9  Decreased  Interest 0 0 0 0 0  Down, Depressed, Hopeless 1 0 0 0 0  PHQ - 2 Score 1 0 0 0 0  Altered sleeping 0 1     Tired, decreased energy 0 2     Change in appetite 0 0     Feeling bad or failure about yourself  0 0     Trouble concentrating 0 0     Moving slowly or fidgety/restless 0 0     Suicidal thoughts 0 0     PHQ-9 Score 1 3     Difficult doing work/chores Not difficult at all Somewhat difficult         04/19/2023    2:51 PM 08/14/2022    8:36 AM 10/03/2019    1:04 PM  GAD 7 : Generalized Anxiety Score  Nervous, Anxious, on Edge 0 1 0  Control/stop worrying 0 1 0  Worry too much - different things 0 1 0  Trouble relaxing 0 0 0  Restless 0 0 0  Easily annoyed or irritable 1 1 0  Afraid - awful might happen 0 0 0  Total GAD 7 Score 1 4 0  Anxiety Difficulty Not difficult at all Somewhat difficult Not difficult at all    ASSESSMENT/PLAN:  Eustachian tube disorder,  bilateral Assessment & Plan: Intermittent ear pain for one week following a viral upper respiratory infection. No fever, hearing loss, or otorrhea. Bilateral tympanic membrane congestion on exam. -Start Flonase and Astelin nasal sprays, two sprays each side twice daily. -Prescribe Augmentin for 5 days, to be started if symptoms do not improve with nasal sprays. -Advise to start probiotics if antibiotics are initiated. -Return to clinic if symptoms worsen or if otorrhea develops.  Orders: -     Fluticasone Propionate; Place 2 sprays into both nostrils 2 (two) times daily.  Dispense: 16 g; Refill: 6 -     Azelastine HCl; Place 2 sprays into both nostrils 2 (two) times daily. Use in each nostril as directed  Dispense: 30 mL; Refill: 12 -     Amoxicillin-Pot Clavulanate; Take 1 tablet by mouth 2 (two) times daily for 5 days.  Dispense: 10 tablet; Refill: 0 -     Saccharomyces boulardii; Take 1 capsule (250 mg total) by mouth daily.  Dispense: 90 capsule; Refill: 0  Milk allergy -     EPINEPHrine; Inject 0.3 mg into the muscle as needed for anaphylaxis.  Dispense: 1 each; Refill: 5  Egg allergy -     EPINEPHrine; Inject 0.3 mg into the muscle as needed for anaphylaxis.  Dispense: 1 each; Refill: 5  Other neutropenia (HCC) Assessment & Plan: Stable, likely secondary to underlying autoimmune condition. No new symptoms. Recent consultation with Hematology was reassuring. -Continue current management and monitoring.     PDMP reviewed  Return if symptoms worsen or fail to improve, for PCP.  Dana Allan, MD

## 2023-04-26 ENCOUNTER — Encounter: Payer: Self-pay | Admitting: Family Medicine

## 2023-04-26 DIAGNOSIS — H6993 Unspecified Eustachian tube disorder, bilateral: Secondary | ICD-10-CM

## 2023-04-26 HISTORY — DX: Unspecified eustachian tube disorder, bilateral: H69.93

## 2023-04-26 NOTE — Assessment & Plan Note (Signed)
Intermittent ear pain for one week following a viral upper respiratory infection. No fever, hearing loss, or otorrhea. Bilateral tympanic membrane congestion on exam. -Start Flonase and Astelin nasal sprays, two sprays each side twice daily. -Prescribe Augmentin for 5 days, to be started if symptoms do not improve with nasal sprays. -Advise to start probiotics if antibiotics are initiated. -Return to clinic if symptoms worsen or if otorrhea develops.

## 2023-04-26 NOTE — Assessment & Plan Note (Signed)
Stable, likely secondary to underlying autoimmune condition. No new symptoms. Recent consultation with Hematology was reassuring. -Continue current management and monitoring.

## 2023-04-29 ENCOUNTER — Encounter: Payer: Self-pay | Admitting: Family Medicine

## 2023-05-17 ENCOUNTER — Ambulatory Visit: Payer: 59 | Admitting: Family Medicine

## 2023-05-28 DIAGNOSIS — Z9889 Other specified postprocedural states: Secondary | ICD-10-CM | POA: Diagnosis not present

## 2023-05-28 DIAGNOSIS — Z01419 Encounter for gynecological examination (general) (routine) without abnormal findings: Secondary | ICD-10-CM | POA: Diagnosis not present

## 2023-05-28 DIAGNOSIS — Z6820 Body mass index (BMI) 20.0-20.9, adult: Secondary | ICD-10-CM | POA: Diagnosis not present

## 2023-05-28 DIAGNOSIS — Z309 Encounter for contraceptive management, unspecified: Secondary | ICD-10-CM | POA: Diagnosis not present

## 2023-05-28 DIAGNOSIS — M6208 Separation of muscle (nontraumatic), other site: Secondary | ICD-10-CM | POA: Diagnosis not present

## 2023-07-05 ENCOUNTER — Encounter: Payer: Self-pay | Admitting: Internal Medicine

## 2023-07-05 ENCOUNTER — Ambulatory Visit: Payer: 59 | Attending: Internal Medicine | Admitting: Internal Medicine

## 2023-07-05 VITALS — BP 102/78 | HR 61 | Ht 67.0 in | Wt 131.4 lb

## 2023-07-05 DIAGNOSIS — I959 Hypotension, unspecified: Secondary | ICD-10-CM

## 2023-07-05 DIAGNOSIS — I361 Nonrheumatic tricuspid (valve) insufficiency: Secondary | ICD-10-CM

## 2023-07-05 DIAGNOSIS — D8940 Mast cell activation, unspecified: Secondary | ICD-10-CM

## 2023-07-05 DIAGNOSIS — J45990 Exercise induced bronchospasm: Secondary | ICD-10-CM | POA: Diagnosis not present

## 2023-07-05 DIAGNOSIS — O88113 Amniotic fluid embolism in pregnancy, third trimester: Secondary | ICD-10-CM

## 2023-07-05 NOTE — Patient Instructions (Signed)
 Medication Instructions:  No Changes *If you need a refill on your cardiac medications before your next appointment, please call your pharmacy*  Lab Work: None  Follow-Up: At [facility], you and your health needs are our priority.  As part of our continuing mission to provide you with exceptional heart care, our providers are all part of one team.  This team includes your primary Cardiologist (physician) and Advanced Practice Providers or APPs (Physician Assistants and Nurse Practitioners) who all work together to provide you with the care you need, when you need it.  Your next appointment:   As Needed  Provider:   Gayatri A Acharya, MD   Other Instructions Please call us  or send a MyChart message with any Cardiology related questions/concerns.  [phone].  Thank you!

## 2023-07-05 NOTE — Progress Notes (Signed)
 Cardiology Office Note:  .   Date:  07/05/2023  ID:  Alison Harrison, DOB [birth year], MRN [id number] PCP: Valli Gaw, MD  [facility] Providers Cardiologist:  Euell Herrlich, MD    History of Present Illness: .   Alison Harrison is a 33 y.o. female.  Discussed the use of AI scribe software for clinical note transcription with the patient, who gave verbal consent to proceed.  History of Present Illness Alison Harrison is a 33 year old female with tricuspid valve regurgitation who presents for follow-up after a previous cardio obstetrics clinic visit.  Tricuspid valve regurgitation was initially identified during pregnancy. During induction at 39 weeks and 4 days, she experienced profound hypotension and bradycardia, requiring phenylephrine  and intubation during an emergent C-section. A CT scan ruled out pulmonary embolism, and her hemodynamics improved post-procedure. Symptoms were attributed to an amniotic fluid embolism.  A transthoracic echocardiogram in July 2022 showed normal ejection fraction and right ventricle, with moderate right atrial enlargement and mild to moderate tricuspid valve regurgitation. A follow-up echocardiogram in February 2023 showed an ejection fraction of 55-60% and a normal right heart with mild tricuspid valve regurgitation.  She experienced dizziness and nausea upon standing a few weeks ago, possibly due to dehydration. No heart failure symptoms since the last visit. She completed a half marathon recently without issues, indicating good exercise tolerance.  She is considering another pregnancy and has consulted with maternal fetal medicine and hematology specialists. No current issues or concerns, including no chest discomfort or shortness of breath during running.    ROS: negative except per HPI above.  Studies Reviewed: Aaron Aas   EKG Interpretation Date/Time:  Thursday Jul 05 2023 08:28:15 EDT Ventricular Rate:  61 PR  Interval:  128 QRS Duration:  72 QT Interval:  422 QTC Calculation: 424 R Axis:   74  Text Interpretation: Normal sinus rhythm Normal ECG Confirmed by Grady Lawman ([id number]) on 07/05/2023 8:50:33 AM    Results RADIOLOGY CT: Negative for acute PE  DIAGNOSTIC Transthoracic echocardiogram: EF normal, RV normal, moderate right atrial enlargement, mild to moderate tricuspid valve regurgitation (09/2020) Echocardiogram: Ejection fraction 55-60%, normal right heart, tricuspid valve with mild regurgitation (04/2021) EKG: Normal (07/05/2023) Risk Assessment/Calculations:       Physical Exam:   VS:  BP 102/78   Pulse 61   Ht 5\' 7"  (1.702 m)   Wt 131 lb 6.4 oz (59.6 kg)   SpO2 95%   BMI 20.58 kg/m    Wt Readings from Last 3 Encounters:  07/05/23 131 lb 6.4 oz (59.6 kg)  04/19/23 135 lb (61.2 kg)  03/20/23 136 lb 1.6 oz (61.7 kg)     Physical Exam GENERAL: Alert, cooperative, well developed, no acute distress. HEENT: Normocephalic, normal oropharynx, moist mucous membranes. CHEST: Clear to auscultation bilaterally, no wheezes, rhonchi, or crackles. CARDIOVASCULAR: Normal heart rate and rhythm, S1 and S2 normal without murmurs. ABDOMEN: Soft, non-tender, non-distended, without organomegaly, normal bowel sounds. EXTREMITIES: No cyanosis or edema. NEUROLOGICAL: Cranial nerves grossly intact, moves all extremities without gross motor or sensory deficit.   ASSESSMENT AND PLAN: .    Assessment and Plan Assessment & Plan Tricuspid valve regurgitation Mild regurgitation with normal right heart function. No heart failure symptoms. Previous symptoms during pregnancy resolved. Likely secondary to pregnancy hemodynamics and volume loading. - Monitor for dyspnea or chest discomfort. - Repeat echocardiogram if new symptoms arise. - Advise maintaining hydration, especially with caffeine intake. - Encourage reporting of worsening symptoms or new  concerns.  Mast cell activation  syndrome Symptoms improved significantly. Cromolyn  weaned without negative effect. Improvement due to antihistamines and possible natural resolution. - Monitor for recurrence of symptoms.  Amniotic fluid embolism History of amniotic fluid embolism managed with C-section and ICU monitoring. No current complications. - No specific follow-up required unless new symptoms develop.      Grady Lawman, MD, [facility]

## 2023-07-24 DIAGNOSIS — N912 Amenorrhea, unspecified: Secondary | ICD-10-CM | POA: Diagnosis not present

## 2023-07-26 DIAGNOSIS — N912 Amenorrhea, unspecified: Secondary | ICD-10-CM | POA: Diagnosis not present

## 2023-08-20 DIAGNOSIS — M6208 Separation of muscle (nontraumatic), other site: Secondary | ICD-10-CM | POA: Diagnosis not present

## 2023-08-20 DIAGNOSIS — D8940 Mast cell activation, unspecified: Secondary | ICD-10-CM | POA: Diagnosis not present

## 2023-08-20 DIAGNOSIS — I369 Nonrheumatic tricuspid valve disorder, unspecified: Secondary | ICD-10-CM | POA: Diagnosis not present

## 2023-08-20 DIAGNOSIS — N911 Secondary amenorrhea: Secondary | ICD-10-CM | POA: Diagnosis not present

## 2023-08-20 DIAGNOSIS — Z98891 History of uterine scar from previous surgery: Secondary | ICD-10-CM | POA: Diagnosis not present

## 2023-08-20 DIAGNOSIS — D729 Disorder of white blood cells, unspecified: Secondary | ICD-10-CM | POA: Diagnosis not present

## 2023-08-27 DIAGNOSIS — Z3481 Encounter for supervision of other normal pregnancy, first trimester: Secondary | ICD-10-CM | POA: Diagnosis not present

## 2023-08-27 DIAGNOSIS — Z3685 Encounter for antenatal screening for Streptococcus B: Secondary | ICD-10-CM | POA: Diagnosis not present

## 2023-08-27 LAB — OB RESULTS CONSOLE HIV ANTIBODY (ROUTINE TESTING): HIV: NONREACTIVE

## 2023-08-27 LAB — OB RESULTS CONSOLE ANTIBODY SCREEN: Antibody Screen: NEGATIVE

## 2023-08-27 LAB — OB RESULTS CONSOLE RPR: RPR: NONREACTIVE

## 2023-08-27 LAB — OB RESULTS CONSOLE RUBELLA ANTIBODY, IGM: Rubella: IMMUNE

## 2023-08-27 LAB — OB RESULTS CONSOLE HEPATITIS B SURFACE ANTIGEN: Hepatitis B Surface Ag: NEGATIVE

## 2023-08-27 LAB — HEPATITIS C ANTIBODY: HCV Ab: NEGATIVE

## 2023-09-03 ENCOUNTER — Other Ambulatory Visit: Payer: Self-pay | Admitting: Obstetrics and Gynecology

## 2023-09-03 DIAGNOSIS — Z3481 Encounter for supervision of other normal pregnancy, first trimester: Secondary | ICD-10-CM | POA: Diagnosis not present

## 2023-09-03 DIAGNOSIS — D8940 Mast cell activation, unspecified: Secondary | ICD-10-CM

## 2023-09-03 LAB — OB RESULTS CONSOLE GC/CHLAMYDIA
Chlamydia: NEGATIVE
Neisseria Gonorrhea: NEGATIVE

## 2023-11-12 DIAGNOSIS — Z3400 Encounter for supervision of normal first pregnancy, unspecified trimester: Secondary | ICD-10-CM | POA: Diagnosis not present

## 2023-11-12 DIAGNOSIS — Z3A20 20 weeks gestation of pregnancy: Secondary | ICD-10-CM | POA: Diagnosis not present

## 2023-11-12 DIAGNOSIS — Z363 Encounter for antenatal screening for malformations: Secondary | ICD-10-CM | POA: Diagnosis not present

## 2023-11-12 DIAGNOSIS — Z361 Encounter for antenatal screening for raised alphafetoprotein level: Secondary | ICD-10-CM | POA: Diagnosis not present

## 2023-12-05 DIAGNOSIS — O09529 Supervision of elderly multigravida, unspecified trimester: Secondary | ICD-10-CM | POA: Insufficient documentation

## 2023-12-05 DIAGNOSIS — O34219 Maternal care for unspecified type scar from previous cesarean delivery: Secondary | ICD-10-CM | POA: Insufficient documentation

## 2023-12-18 ENCOUNTER — Ambulatory Visit: Admitting: Obstetrics and Gynecology

## 2023-12-18 ENCOUNTER — Ambulatory Visit: Attending: Obstetrics and Gynecology

## 2023-12-18 VITALS — BP 111/70

## 2023-12-18 DIAGNOSIS — O34219 Maternal care for unspecified type scar from previous cesarean delivery: Secondary | ICD-10-CM | POA: Insufficient documentation

## 2023-12-18 DIAGNOSIS — O99412 Diseases of the circulatory system complicating pregnancy, second trimester: Secondary | ICD-10-CM | POA: Diagnosis not present

## 2023-12-18 DIAGNOSIS — O09522 Supervision of elderly multigravida, second trimester: Secondary | ICD-10-CM

## 2023-12-18 DIAGNOSIS — O09292 Supervision of pregnancy with other poor reproductive or obstetric history, second trimester: Secondary | ICD-10-CM

## 2023-12-18 DIAGNOSIS — I079 Rheumatic tricuspid valve disease, unspecified: Secondary | ICD-10-CM

## 2023-12-18 DIAGNOSIS — O09529 Supervision of elderly multigravida, unspecified trimester: Secondary | ICD-10-CM

## 2023-12-18 DIAGNOSIS — D8940 Mast cell activation, unspecified: Secondary | ICD-10-CM | POA: Diagnosis not present

## 2023-12-18 DIAGNOSIS — Z8759 Personal history of other complications of pregnancy, childbirth and the puerperium: Secondary | ICD-10-CM | POA: Insufficient documentation

## 2023-12-18 DIAGNOSIS — Z3A25 25 weeks gestation of pregnancy: Secondary | ICD-10-CM | POA: Diagnosis not present

## 2023-12-18 DIAGNOSIS — O99112 Other diseases of the blood and blood-forming organs and certain disorders involving the immune mechanism complicating pregnancy, second trimester: Secondary | ICD-10-CM

## 2023-12-18 NOTE — Progress Notes (Signed)
 Maternal-Fetal Medicine Consultation  Name: Alison Harrison  MRN: [id number]  GA: [id number] [redacted]w[redacted]d   Patient is here for fetal anatomy scan. She reports she had cell-free fetal DNA screening at your office that showed low risk for fetal aneuploidies. 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 -Previous cesarean delivery.  In October 2024, patient had preconception consultation at our office.  Obstetrical history is significant for a term vaginal delivery in 2020 of a female infant weighing 7 pounds 11 ounces at birth.  Her pregnancy and delivery were uncomplicated.  In July 2022, her pregnancy was complicated by COVID-19 infection and 3 weeks later she had induction of labor at [redacted] weeks gestation.  She had severe hypotension during induction and fetal bradycardia that led to emergency cesarean delivery.  CT scan ruled out pulmonary embolism.  Because of suspicion of amniotic fluid embolism (AFE), she underwent emergency cesarean delivery and delivered a female infant weighing 7 pounds and 6 ounces at birth.  Her son is in good health. Postoperatively, she had complete recovery.  She did not have any coagulation abnormalities. Placental studies were unremarkable.  An echocardiography performed in January 2023 showed a normal ejection fraction of 55% to 60%.  In May 2025, she had echocardiography that showed mild tricuspid regurgitation.  Patient reports she does not have cardiovascular symptoms and had run a marathon last year without problems.  A month after delivery she had tightness in the chest and hives and facial swelling all over the body.  She had abdominal cramping and vomiting.  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  Obtains levels were within normal range. Patient was taking cromolyn  solution, levocetirizine (Xyzal ), famotidine  (Pepcid ).  She discontinued cromolyn  solution in this  pregnancy.  Patient had Mirena  IUD insertion and removed for planned conception. Allergies: Tetracycline (hives).  Ultrasound Fetal biometry is consistent with her previously-established dates. Normal amniotic fluid.  No makers of aneuploidies or fetal structural defects are seen.  Patient understands the limitations of ultrasound in detecting fetal anomalies.  Placenta is anterior and there is no evidence of previa or placenta accreta spectrum.  Our concerns include: History of amniotic fluid embolism Patient and her providers aware of the opinion that the diagnosis of AF he was uncertain.  Recurrence of amniotic fluid embolism is extremely unlikely.  Mast cell degranulation and release of histamine, tryptase and kinins may have led to AFT.  Patient also had COVID-19 infection before delivery.  Mast cell activation syndrome (MCAS) Patient gets hives infrequently.  I reassured the patient that H1 blockers (Xyzal ) are safe in pregnancy and no increased congenital malformations have been reported.  Preeclampsia is probably increased and low-dose aspirin may be beneficial.  Previous cesarean delivery I reassured the patient of normal placental location and that there is no evidence of previa or placenta accreta spectrum.  I discussed the benefits and risks of VBAC.  The risk of uterine scar dehiscence is about 1%.  Cesarean deliveries increase the risks of hemorrhage, infection and venous thromboembolism.  Repeat cesarean deliveries increase the risks of placenta previa and/or placenta accreta spectrum. Long-term complications include small bowel obstruction.  Based on [facility] calculator, the likelihood of successful vaginal delivery is 90%. Patient was counseled that this is only an approximate calculation.  Patient is worried that induction of labor or vaginal delivery may precipitate amniotic fluid embolism. I reassured her that vaginal delivery is unlikely to lead to  AFE. She will discuss with you and decide on the mode of delivery.  Recommendations - Fetal growth assessment at 32 weeks that may be performed at your office.   Consultation including face-to-face (more than 50%) counseling 50 minutes.

## 2023-12-31 DIAGNOSIS — Z3A27 27 weeks gestation of pregnancy: Secondary | ICD-10-CM | POA: Diagnosis not present

## 2023-12-31 DIAGNOSIS — Z3480 Encounter for supervision of other normal pregnancy, unspecified trimester: Secondary | ICD-10-CM | POA: Diagnosis not present

## 2023-12-31 DIAGNOSIS — Z23 Encounter for immunization: Secondary | ICD-10-CM | POA: Diagnosis not present

## 2023-12-31 DIAGNOSIS — O34219 Maternal care for unspecified type scar from previous cesarean delivery: Secondary | ICD-10-CM | POA: Diagnosis not present

## 2023-12-31 DIAGNOSIS — Z3400 Encounter for supervision of normal first pregnancy, unspecified trimester: Secondary | ICD-10-CM | POA: Diagnosis not present

## 2024-02-13 DIAGNOSIS — Z3483 Encounter for supervision of other normal pregnancy, third trimester: Secondary | ICD-10-CM | POA: Diagnosis not present

## 2024-02-13 DIAGNOSIS — Z3A33 33 weeks gestation of pregnancy: Secondary | ICD-10-CM | POA: Diagnosis not present

## 2024-02-13 DIAGNOSIS — O09299 Supervision of pregnancy with other poor reproductive or obstetric history, unspecified trimester: Secondary | ICD-10-CM | POA: Diagnosis not present

## 2024-03-04 LAB — OB RESULTS CONSOLE GBS: GBS: NEGATIVE

## 2024-03-12 NOTE — H&P (Signed)
 Mattia Osterman is a 34 y.o. female presenting for scheduled RCS with BTL. Hx prior SVD. She has a hx of priro LTCS in the setting of presumed AFE. She has been on asa to [facility] prevention. She was ultimately diagnosed with Mast Cell Activation Syndrome which has been controlled with xyzal  and pepcid . She is a expecting a RR girl [facility] History     Gravida  7   Para  2   Term  2   Preterm      AB  4   Living  2      SAB  4   IAB      Ectopic      Multiple  0   Live Births  2          Past Medical History:  Diagnosis Date   Abrasion of wrist, right    right wrist sting wray laceration right wrist   Allergy    Chicken pox    COVID-19    10/2020, 11/2020   Dislocation of jaw 10/03/2019   Dizziness 12/06/2020   Eustachian tube disorder, bilateral 04/26/2023   Exercise-induced asthma    GERD (gastroesophageal reflux disease)    Hives 01/10/2021   Increased thirst 07/31/2017   Kidney stones    Milk allergy 12/06/2020   Multiple food allergies 01/10/2021   Multiple nevi 07/31/2017   Pneumonia 06/2017   Rash and nonspecific skin eruption 06/17/2022   Tick bite    Tricuspid regurgitation    Viral upper respiratory tract infection 01/12/2021   Past Surgical History:  Procedure Laterality Date   CESAREAN SECTION N/A 09/23/2020   Procedure: CESAREAN SECTION;  Surgeon: Marne Kelly Nest, MD;  Location: [facility] LD ORS;  Service: Obstetrics;  Laterality: N/A;   WISDOM TOOTH EXTRACTION     Family History: family history includes Alcohol abuse in her maternal grandfather; Arthritis in her maternal grandmother; Cancer in her paternal grandfather and paternal grandmother; Diabetes in her maternal grandfather; Hypertension in her mother; Miscarriages / Stillbirths in her sister. Social History:  reports that she has never smoked. She has never used smokeless tobacco. She reports that she does not currently use alcohol. She reports that she does not use drugs.      Maternal Diabetes: No Genetic Screening: Normal Maternal Ultrasounds/Referrals: Normal Fetal Ultrasounds or other Referrals:  None Maternal Substance Abuse:  No Significant Maternal Medications:  None Significant Maternal Lab Results:  None Number of Prenatal Visits:greater than 3 verified prenatal visits  Review of Systems History   Last menstrual period 06/23/2023. Exam Physical Exam  (from office) NAD, A&O NWOB Abd soft, nondistended, gravid  Prenatal labs: ABO, Rh:   Antibody:   Rubella:   RPR:    HBsAg:    HIV:    GBS:     Assessment/Plan: 34 yo [id number] at 5 wga presenting for scheduled repeat CS with requested bilateral tubal ligation. Risks discussed including infection, bleeding, damage to surrounding structures, the need for additional procedures including hysterectomy, and the possibility of uterine rupture with neonatal morbidity/mortality, scarring, and abnormal placentation with subsequent pregnancies. We also discussed bilateral tubal ligation in detail. The alternatives to permanent sterilization were reviewed and it was discussed that this is considered permanent. We reviewed the risks in detail including regret, failure and ectopic pregnancy. She understands and agrees to proceed.  2g ancef  and TXA on call to OR.     Kelly Nest Marne 03/12/2024, 3:27 PM

## 2024-03-13 ENCOUNTER — Encounter (HOSPITAL_COMMUNITY): Payer: Self-pay

## 2024-03-13 NOTE — Patient Instructions (Signed)
"   Alison Harrison  03/13/2024   Your procedure is scheduled on:  03/28/2023  Arrive at 0530 at Entrance C on [address] at [facility]  and [address]. You are invited to use the FREE valet parking or use the Visitor's parking deck.  Pick up the phone at the desk and dial [phone].  Call this number if you have problems the morning of surgery: [phone]  Remember:   Do not eat food:(After Midnight) Desps de medianoche.  You may drink clear liquids until  __0330___.  Clear liquids means a liquid you can see thru.  It can have color such as Cola or Kool aid.  Tea is OK and coffee as long as no milk or creamer of any kind.  Take these medicines the morning of surgery with A SIP OF WATER :  none   Do not wear jewelry, make-up or nail polish.  Do not wear lotions, powders, or perfumes. Do not wear deodorant.  Do not shave 48 hours prior to surgery.  Do not bring valuables to the hospital.  [facility] is not   responsible for any belongings or valuables brought to the hospital.  Contacts, dentures or bridgework may not be worn into surgery.  Leave suitcase in the car. After surgery it may be brought to your room.  For patients admitted to the hospital, checkout time is 11:00 AM the day of              discharge.      Please read over the following fact sheets that you were given:     Preparing for Surgery   "

## 2024-03-25 ENCOUNTER — Encounter (HOSPITAL_COMMUNITY)
Admission: RE | Admit: 2024-03-25 | Discharge: 2024-03-25 | Disposition: A | Source: Ambulatory Visit | Attending: Obstetrics and Gynecology

## 2024-03-25 DIAGNOSIS — Z01812 Encounter for preprocedural laboratory examination: Secondary | ICD-10-CM | POA: Insufficient documentation

## 2024-03-25 HISTORY — DX: Personal history of other complications of pregnancy, childbirth and the puerperium: Z87.59

## 2024-03-25 HISTORY — DX: Mast cell activation, unspecified: D89.40

## 2024-03-25 LAB — CBC
HCT: 35.3 % — ABNORMAL LOW (ref 36.0–46.0)
Hemoglobin: 11.9 g/dL — ABNORMAL LOW (ref 12.0–15.0)
MCH: 31.3 pg (ref 26.0–34.0)
MCHC: 33.7 g/dL (ref 30.0–36.0)
MCV: 92.9 fL (ref 80.0–100.0)
Platelets: 144 K/uL — ABNORMAL LOW (ref 150–400)
RBC: 3.8 MIL/uL — ABNORMAL LOW (ref 3.87–5.11)
RDW: 12 % (ref 11.5–15.5)
WBC: 6.9 K/uL (ref 4.0–10.5)
nRBC: 0 % (ref 0.0–0.2)

## 2024-03-25 LAB — TYPE AND SCREEN
ABO/RH(D): A POS
Antibody Screen: NEGATIVE

## 2024-03-25 LAB — SYPHILIS: RPR W/REFLEX TO RPR TITER AND TREPONEMAL ANTIBODIES, TRADITIONAL SCREENING AND DIAGNOSIS ALGORITHM: RPR Ser Ql: NONREACTIVE

## 2024-03-26 NOTE — Anesthesia Preprocedure Evaluation (Signed)
 "                                  Anesthesia Evaluation  Patient identified by MRN, date of birth, ID band Patient awake    Reviewed: Allergy & Precautions, NPO status , Patient's Chart, lab work & pertinent test results  Airway Mallampati: I  TM Distance: >3 FB Neck ROM: Full    Dental no notable dental hx. (+) Teeth Intact, Dental Advisory Given   Pulmonary asthma , pneumonia, resolved   Pulmonary exam normal breath sounds clear to auscultation       Cardiovascular negative cardio ROS  Rhythm:Regular Rate:Normal     Neuro/Psych negative neurological ROS  negative psych ROS   GI/Hepatic Neg liver ROS,GERD  Medicated,,  Endo/Other  negative endocrine ROS    Renal/GU Renal diseaseHx/o renal calculi  negative genitourinary   Musculoskeletal negative musculoskeletal ROS (+)    Abdominal   Peds  Hematology  (+) Blood dyscrasia, anemia Lab Results      Component                Value               Date                      WBC                      6.9                 03/25/2024                HGB                      11.9 (L)            03/25/2024                HCT                      35.3 (L)            03/25/2024                MCV                      92.9                03/25/2024                PLT                      144 (L)             03/25/2024              Anesthesia Other Findings   Reproductive/Obstetrics (+) Pregnancy Desires sterilization Hx/o previous C/Section                              Anesthesia Physical Anesthesia Plan  ASA: 2  Anesthesia Plan: Spinal   Post-op Pain Management: Minimal or no pain anticipated   Induction: Intravenous  PONV Risk Score and Plan: 4 or greater and Treatment may vary due to age or medical condition and Scopolamine  patch - Pre-op  Airway Management Planned: Natural Airway  Additional  Equipment: None and Fetal Monitoring  Intra-op Plan:   Post-operative Plan:    Informed Consent: I have reviewed the patients History and Physical, chart, labs and discussed the procedure including the risks, benefits and alternatives for the proposed anesthesia with the patient or authorized representative who has indicated his/her understanding and acceptance.     Dental advisory given  Plan Discussed with: CRNA and Anesthesiologist  Anesthesia Plan Comments:          Anesthesia Quick Evaluation  "

## 2024-03-27 ENCOUNTER — Inpatient Hospital Stay (HOSPITAL_COMMUNITY)
Admission: RE | Admit: 2024-03-27 | Discharge: 2024-03-29 | DRG: 785 | Disposition: A | Discharge status: Home / Self Care | Attending: Obstetrics and Gynecology | Admitting: Obstetrics and Gynecology

## 2024-03-27 ENCOUNTER — Encounter (HOSPITAL_COMMUNITY): Payer: Self-pay | Admitting: Anesthesiology

## 2024-03-27 ENCOUNTER — Encounter (HOSPITAL_COMMUNITY)
Admission: RE | Disposition: A | Discharge status: Home / Self Care | Payer: Self-pay | Source: Home / Self Care | Attending: Obstetrics and Gynecology

## 2024-03-27 ENCOUNTER — Encounter (HOSPITAL_COMMUNITY): Payer: Self-pay | Admitting: Obstetrics and Gynecology

## 2024-03-27 ENCOUNTER — Inpatient Hospital Stay (HOSPITAL_COMMUNITY): Payer: Self-pay | Admitting: Anesthesiology

## 2024-03-27 DIAGNOSIS — D8940 Mast cell activation, unspecified: Secondary | ICD-10-CM | POA: Diagnosis present

## 2024-03-27 DIAGNOSIS — K219 Gastro-esophageal reflux disease without esophagitis: Secondary | ICD-10-CM | POA: Diagnosis present

## 2024-03-27 DIAGNOSIS — Z3A39 39 weeks gestation of pregnancy: Secondary | ICD-10-CM

## 2024-03-27 DIAGNOSIS — Z302 Encounter for sterilization: Secondary | ICD-10-CM | POA: Diagnosis not present

## 2024-03-27 DIAGNOSIS — O9902 Anemia complicating childbirth: Secondary | ICD-10-CM | POA: Diagnosis present

## 2024-03-27 DIAGNOSIS — O34211 Maternal care for low transverse scar from previous cesarean delivery: Secondary | ICD-10-CM | POA: Diagnosis present

## 2024-03-27 DIAGNOSIS — Z833 Family history of diabetes mellitus: Secondary | ICD-10-CM | POA: Diagnosis not present

## 2024-03-27 DIAGNOSIS — O99892 Other specified diseases and conditions complicating childbirth: Secondary | ICD-10-CM | POA: Diagnosis present

## 2024-03-27 DIAGNOSIS — Z3490 Encounter for supervision of normal pregnancy, unspecified, unspecified trimester: Secondary | ICD-10-CM

## 2024-03-27 DIAGNOSIS — O9962 Diseases of the digestive system complicating childbirth: Secondary | ICD-10-CM | POA: Diagnosis present

## 2024-03-27 DIAGNOSIS — O9912 Other diseases of the blood and blood-forming organs and certain disorders involving the immune mechanism complicating childbirth: Secondary | ICD-10-CM | POA: Diagnosis present

## 2024-03-27 DIAGNOSIS — Z8249 Family history of ischemic heart disease and other diseases of the circulatory system: Secondary | ICD-10-CM

## 2024-03-27 DIAGNOSIS — Z98891 History of uterine scar from previous surgery: Principal | ICD-10-CM

## 2024-03-27 MED ORDER — SCOPOLAMINE 1 MG/3DAYS TD PT72
1.0000 | MEDICATED_PATCH | TRANSDERMAL | Status: DC
  Administered 2024-03-27: 1 mg via TRANSDERMAL

## 2024-03-27 MED ORDER — LACTATED RINGERS IV SOLN: INTRAVENOUS | Status: DC | PRN

## 2024-03-27 MED ORDER — FLUTICASONE PROPIONATE 50 MCG/ACT NA SUSP
2.0000 | Freq: Two times a day (BID) | NASAL | Status: DC
  Administered 2024-03-28: 2 via NASAL
  Filled 2024-03-27: qty 16

## 2024-03-27 MED ORDER — DEXAMETHASONE SOD PHOSPHATE PF 10 MG/ML IJ SOLN
INTRAMUSCULAR | Status: AC
  Filled 2024-03-27: qty 1

## 2024-03-27 MED ORDER — MORPHINE SULFATE (PF) 0.5 MG/ML IJ SOLN
INTRAMUSCULAR | Status: DC | PRN
  Administered 2024-03-27: .15 mg via INTRATHECAL

## 2024-03-27 MED ORDER — COCONUT OIL OIL
1.0000 | TOPICAL_OIL | Status: DC | PRN
  Administered 2024-03-28: 1 via TOPICAL

## 2024-03-27 MED ORDER — ZOLPIDEM TARTRATE 5 MG PO TABS: 5.0000 mg | ORAL_TABLET | Freq: Every evening | ORAL | Status: DC | PRN

## 2024-03-27 MED ORDER — FENTANYL CITRATE (PF) 100 MCG/2ML IJ SOLN
INTRAMUSCULAR | Status: DC | PRN
  Administered 2024-03-27: 15 ug via INTRATHECAL

## 2024-03-27 MED ORDER — DIPHENHYDRAMINE HCL 50 MG/ML IJ SOLN
12.5000 mg | INTRAMUSCULAR | Status: DC | PRN
  Administered 2024-03-27 (×2): 12.5 mg via INTRAVENOUS
  Filled 2024-03-27 (×2): qty 1

## 2024-03-27 MED ORDER — KETOROLAC TROMETHAMINE 30 MG/ML IJ SOLN: 30.0000 mg | Freq: Four times a day (QID) | INTRAMUSCULAR | Status: AC | PRN

## 2024-03-27 MED ORDER — LORATADINE 10 MG PO TABS
10.0000 mg | ORAL_TABLET | Freq: Every day | ORAL | Status: DC
  Administered 2024-03-27 – 2024-03-29 (×3): 10 mg via ORAL
  Filled 2024-03-27 (×4): qty 1

## 2024-03-27 MED ORDER — KETOROLAC TROMETHAMINE 30 MG/ML IJ SOLN
30.0000 mg | Freq: Four times a day (QID) | INTRAMUSCULAR | Status: AC
  Administered 2024-03-27 – 2024-03-28 (×4): 30 mg via INTRAVENOUS
  Filled 2024-03-27 (×4): qty 1

## 2024-03-27 MED ORDER — OXYTOCIN-SODIUM CHLORIDE 30-0.9 UT/500ML-% IV SOLN
INTRAVENOUS | Status: DC | PRN
  Administered 2024-03-27: 300 mL via INTRAVENOUS

## 2024-03-27 MED ORDER — FENTANYL CITRATE (PF) 100 MCG/2ML IJ SOLN: 25.0000 ug | INTRAMUSCULAR | Status: DC | PRN

## 2024-03-27 MED ORDER — FENTANYL CITRATE (PF) 100 MCG/2ML IJ SOLN
INTRAMUSCULAR | Status: AC
  Filled 2024-03-27: qty 2

## 2024-03-27 MED ORDER — ACETAMINOPHEN 500 MG PO TABS
1000.0000 mg | ORAL_TABLET | Freq: Four times a day (QID) | ORAL | Status: DC
  Administered 2024-03-27 – 2024-03-29 (×7): 1000 mg via ORAL
  Filled 2024-03-27 (×7): qty 2

## 2024-03-27 MED ORDER — FAMOTIDINE 20 MG PO TABS
40.0000 mg | ORAL_TABLET | Freq: Two times a day (BID) | ORAL | Status: DC
  Administered 2024-03-27 – 2024-03-29 (×4): 40 mg via ORAL
  Filled 2024-03-27 (×5): qty 2

## 2024-03-27 MED ORDER — MORPHINE SULFATE (PF) 0.5 MG/ML IJ SOLN
INTRAMUSCULAR | Status: AC
  Filled 2024-03-27: qty 10

## 2024-03-27 MED ORDER — ALBUTEROL SULFATE HFA 108 (90 BASE) MCG/ACT IN AERS: 2.0000 | INHALATION_SPRAY | Freq: Four times a day (QID) | RESPIRATORY_TRACT | Status: DC | PRN

## 2024-03-27 MED ORDER — SOD CITRATE-CITRIC ACID 500-334 MG/5ML PO SOLN: 30.0000 mL | Freq: Once | ORAL | Status: DC

## 2024-03-27 MED ORDER — TRANEXAMIC ACID-NACL 1000-0.7 MG/100ML-% IV SOLN
INTRAVENOUS | Status: AC
  Filled 2024-03-27: qty 100

## 2024-03-27 MED ORDER — ACETAMINOPHEN 10 MG/ML IV SOLN
INTRAVENOUS | Status: DC | PRN
  Administered 2024-03-27: 1000 mg via INTRAVENOUS

## 2024-03-27 MED ORDER — CEFAZOLIN SODIUM-DEXTROSE 2-4 GM/100ML-% IV SOLN
2.0000 g | INTRAVENOUS | Status: AC
  Administered 2024-03-27: 2 g via INTRAVENOUS

## 2024-03-27 MED ORDER — NALOXONE HCL 4 MG/10ML IJ SOLN: 1.0000 ug/kg/h | INTRAVENOUS | Status: DC | PRN

## 2024-03-27 MED ORDER — TRANEXAMIC ACID-NACL 1000-0.7 MG/100ML-% IV SOLN
1000.0000 mg | Freq: Once | INTRAVENOUS | Status: AC
  Administered 2024-03-27: 1000 mg via INTRAVENOUS

## 2024-03-27 MED ORDER — ONDANSETRON HCL 4 MG/2ML IJ SOLN
INTRAMUSCULAR | Status: AC
  Filled 2024-03-27: qty 2

## 2024-03-27 MED ORDER — SOD CITRATE-CITRIC ACID 500-334 MG/5ML PO SOLN
ORAL | Status: AC
  Filled 2024-03-27: qty 30

## 2024-03-27 MED ORDER — LACTATED RINGERS IV SOLN: INTRAVENOUS | Status: DC

## 2024-03-27 MED ORDER — METHYLERGONOVINE MALEATE 0.2 MG/ML IJ SOLN
0.2000 mg | Freq: Once | INTRAMUSCULAR | Status: AC
  Administered 2024-03-27: 0.2 mg via INTRAMUSCULAR
  Filled 2024-03-27: qty 1

## 2024-03-27 MED ORDER — EPINEPHRINE 0.3 MG/0.3ML IJ SOAJ: 0.3000 mg | INTRAMUSCULAR | Status: DC | PRN

## 2024-03-27 MED ORDER — DEXMEDETOMIDINE HCL IN NACL 80 MCG/20ML IV SOLN
INTRAVENOUS | Status: DC | PRN
  Administered 2024-03-27: 4 ug via INTRAVENOUS

## 2024-03-27 MED ORDER — SODIUM CHLORIDE 0.9% FLUSH: 3.0000 mL | INTRAVENOUS | Status: DC | PRN

## 2024-03-27 MED ORDER — PRENATAL MULTIVITAMIN CH
1.0000 | ORAL_TABLET | Freq: Every day | ORAL | Status: DC
  Administered 2024-03-28 – 2024-03-29 (×2): 1 via ORAL
  Filled 2024-03-27 (×2): qty 1

## 2024-03-27 MED ORDER — ACETAMINOPHEN 10 MG/ML IV SOLN
INTRAVENOUS | Status: AC
  Filled 2024-03-27: qty 100

## 2024-03-27 MED ORDER — DEXMEDETOMIDINE HCL IN NACL 80 MCG/20ML IV SOLN
INTRAVENOUS | Status: AC
  Filled 2024-03-27: qty 20

## 2024-03-27 MED ORDER — PHENYLEPHRINE HCL-NACL 20-0.9 MG/250ML-% IV SOLN
INTRAVENOUS | Status: AC
  Filled 2024-03-27: qty 250

## 2024-03-27 MED ORDER — SENNOSIDES-DOCUSATE SODIUM 8.6-50 MG PO TABS
2.0000 | ORAL_TABLET | Freq: Every day | ORAL | Status: DC
  Administered 2024-03-28 – 2024-03-29 (×2): 2 via ORAL
  Filled 2024-03-27 (×2): qty 2

## 2024-03-27 MED ORDER — NALOXONE HCL 0.4 MG/ML IJ SOLN: 0.4000 mg | INTRAMUSCULAR | Status: DC | PRN

## 2024-03-27 MED ORDER — OXYCODONE HCL 5 MG PO TABS
5.0000 mg | ORAL_TABLET | ORAL | Status: DC | PRN
  Administered 2024-03-29: 5 mg via ORAL
  Filled 2024-03-27: qty 1

## 2024-03-27 MED ORDER — LEVOCETIRIZINE DIHYDROCHLORIDE 5 MG PO TABS: 5.0000 mg | ORAL_TABLET | Freq: Two times a day (BID) | ORAL | Status: DC

## 2024-03-27 MED ORDER — IBUPROFEN 600 MG PO TABS
600.0000 mg | ORAL_TABLET | Freq: Four times a day (QID) | ORAL | Status: DC
  Administered 2024-03-28 – 2024-03-29 (×5): 600 mg via ORAL
  Filled 2024-03-27 (×5): qty 1

## 2024-03-27 MED ORDER — DEXAMETHASONE SOD PHOSPHATE PF 10 MG/ML IJ SOLN
INTRAMUSCULAR | Status: DC | PRN
  Administered 2024-03-27: 10 mg via INTRAVENOUS

## 2024-03-27 MED ORDER — MENTHOL 3 MG MT LOZG: 1.0000 | LOZENGE | OROMUCOSAL | Status: DC | PRN

## 2024-03-27 MED ORDER — SIMETHICONE 80 MG PO CHEW: 80.0000 mg | CHEWABLE_TABLET | ORAL | Status: DC | PRN

## 2024-03-27 MED ORDER — ONDANSETRON HCL 4 MG/2ML IJ SOLN: 4.0000 mg | Freq: Three times a day (TID) | INTRAMUSCULAR | Status: DC | PRN

## 2024-03-27 MED ORDER — PHENYLEPHRINE HCL-NACL 20-0.9 MG/250ML-% IV SOLN
INTRAVENOUS | Status: DC | PRN
  Administered 2024-03-27: 60 ug/min via INTRAVENOUS

## 2024-03-27 MED ORDER — SCOPOLAMINE 1 MG/3DAYS TD PT72
MEDICATED_PATCH | TRANSDERMAL | Status: AC
  Filled 2024-03-27: qty 1

## 2024-03-27 MED ORDER — SODIUM CHLORIDE 0.9 % IV SOLN: 500.0000 mg | INTRAVENOUS | Status: DC

## 2024-03-27 MED ORDER — OXYTOCIN-SODIUM CHLORIDE 30-0.9 UT/500ML-% IV SOLN
INTRAVENOUS | Status: AC
  Filled 2024-03-27: qty 500

## 2024-03-27 MED ORDER — OXYTOCIN-SODIUM CHLORIDE 30-0.9 UT/500ML-% IV SOLN: 2.5000 [IU]/h | INTRAVENOUS | Status: AC

## 2024-03-27 MED ORDER — CEFAZOLIN SODIUM-DEXTROSE 2-4 GM/100ML-% IV SOLN
INTRAVENOUS | Status: AC
  Filled 2024-03-27: qty 100

## 2024-03-27 MED ORDER — DIPHENHYDRAMINE HCL 25 MG PO CAPS: 25.0000 mg | ORAL_CAPSULE | Freq: Four times a day (QID) | ORAL | Status: DC | PRN

## 2024-03-27 MED ORDER — DIBUCAINE (PERIANAL) 1 % EX OINT: 1.0000 | TOPICAL_OINTMENT | CUTANEOUS | Status: DC | PRN

## 2024-03-27 MED ORDER — SIMETHICONE 80 MG PO CHEW
80.0000 mg | CHEWABLE_TABLET | Freq: Three times a day (TID) | ORAL | Status: DC
  Administered 2024-03-27 – 2024-03-29 (×6): 80 mg via ORAL
  Filled 2024-03-27 (×6): qty 1

## 2024-03-27 MED ORDER — BUPIVACAINE IN DEXTROSE 0.75-8.25 % IT SOLN
INTRATHECAL | Status: DC | PRN
  Administered 2024-03-27: 1.8 mL via INTRATHECAL

## 2024-03-27 MED ORDER — ONDANSETRON HCL 4 MG/2ML IJ SOLN
INTRAMUSCULAR | Status: DC | PRN
  Administered 2024-03-27: 4 mg via INTRAVENOUS

## 2024-03-27 MED ORDER — STERILE WATER FOR IRRIGATION IR SOLN
Status: DC | PRN
  Administered 2024-03-27: 1000 mL

## 2024-03-27 MED ORDER — WITCH HAZEL-GLYCERIN EX PADS: 1.0000 | MEDICATED_PAD | CUTANEOUS | Status: DC | PRN

## 2024-03-27 MED ORDER — SOD CITRATE-CITRIC ACID 500-334 MG/5ML PO SOLN
30.0000 mL | ORAL | Status: AC
  Administered 2024-03-27: 30 mL via ORAL

## 2024-03-27 MED ORDER — MEPERIDINE HCL 25 MG/ML IJ SOLN: 6.2500 mg | INTRAMUSCULAR | Status: DC | PRN

## 2024-03-27 MED ORDER — DIPHENHYDRAMINE HCL 25 MG PO CAPS: 25.0000 mg | ORAL_CAPSULE | ORAL | Status: DC | PRN

## 2024-03-27 MED ORDER — HYDROMORPHONE HCL 1 MG/ML IJ SOLN: 0.2000 mg | INTRAMUSCULAR | Status: DC | PRN

## 2024-03-27 NOTE — Progress Notes (Signed)
 At bedside to check on patient this morning for postpartum bleeding check.   Moderately increased bleeding on her initial postpartum pad, including golf-ball sized clot. Uterus is not distended, high.  At bedside, no further trickle or active bleeding noted.   Patient very anxious regarding postpartum course due to prior history of suspected AFE.  She denies lightheadedness, dizziness, SOB, CP.  Postoperative pain is within expectations.   BP 120/73 (BP Location: Left Arm)   Pulse 62   Temp 98.3 F (36.8 C) (Oral)   Resp 18   Ht 5' 8 (1.727 m)   Wt 78.2 kg   LMP 06/23/2023   SpO2 97%   Breastfeeding Unknown   BMI 26.23 kg/m   Patient reassured. Methergine  given for increased bleeding.   I checked on the patient again this afternoon and she is doing well with no further bleeding.

## 2024-03-27 NOTE — Progress Notes (Signed)
No updates to above H&P. Patient arrived NPO and was consented in PACU. Risks again discussed, all questions answered, and consent signed. Proceed with above surgery.    Kailo Kosik MD  

## 2024-03-27 NOTE — Lactation Note (Signed)
 This note was copied from a baby's chart. Lactation Consultation Note  Patient Name: Alison Harrison Unijb'd Date: 03/27/2024 Age:35 hours Reason for consult: Initial assessment;Term P3, Per MOB, infant is breastfeeding well most feedings are 20 to 30 minutes in length and infant recently breastfeed for 35 minutes at 1530 pm. Infant had 3 voids and one stool since birth. MOB will continue to breastfeed infant by cues, on demand, 8-12 times within 24 hours, skin to skin. MOB would like to be placed in PRN. MOB is experienced with breastfeeding see maternal data below. LC discussed importance of maternal rest, meals and hydration. MOB knows that 2nd day of life infant will cluster feed and this is normal pattern of behavior. MOB was  made aware of O/P services, breastfeeding support groups, community resources, and our phone # for post-discharge questions.    Maternal Data Has patient been taught Hand Expression?: Yes Does the patient have breastfeeding experience prior to this delivery?: Yes How long did the patient breastfeed?: Per mOB, she breastfeed her first child for 10 months and her 2nd child for 13 months  Feeding Mother's Current Feeding Choice: Breast Milk  LATCH Score  LC did not observe latch due infant recently breastfeeding 1 hour prior to [facility] entering the room.                   Lactation Tools Discussed/Used    Interventions Interventions: Breast feeding basics reviewed;Education;LC Services brochure  Discharge Pump: DEBP;Personal  Consult Status Consult Status: PRN    Grayce LULLA Batter 03/27/2024, 3:56 PM

## 2024-03-27 NOTE — Progress Notes (Signed)
 Patient passed a few small clots and RN unable to palpate fundus Dr. Lequita aware. Dr Lequita came to see patient and has no concerns at this time.

## 2024-03-27 NOTE — Op Note (Signed)
 PROCEDURE DATE:  03/27/24   PREOPERATIVE DIAGNOSIS: Intrauterine pregnancy at  13 wga, Indication: repeat and desires permanent sterility.    POSTOPERATIVE DIAGNOSIS: The same   PROCEDURE:    Repeat Low Transverse Cesarean Section with BTL   SURGEON:  Dr. Kelly Milian  ASSISTANT: None     INDICATIONS: This is a 34 yo [id number] at 67 wga requiring cesarean section secondary to repeat and desires permanent sterility. Decision made to proceed with LTCS. The risks of cesarean section discussed with the patient included but were not limited to: bleeding which may require transfusion or reoperation; infection which may require antibiotics; injury to bowel, bladder, ureters or other surrounding organs; injury to the fetus; need for additional procedures including hysterectomy in the event of a life-threatening hemorrhage; placental abnormalities wth subsequent pregnancies, incisional problems, thromboembolic phenomenon and other postoperative/anesthesia complications. We also discussed bilateral tubal ligation in detail. The alternatives to permanent sterilization were reviewed and it was discussed that this is considered permanent. We reviewed the risks in detail including regret, failure and ectopic pregnancy.   The patient agreed with the proposed plan, giving informed consent for the procedure.     FINDINGS:  Viable female infant in vertex presentation, APGARs pending,  Weight pending, Amniotic fluid clear,  Intact placenta, three vessel cord.  Grossly normal uterus, ovaries and fallopian tubes. .   ANESTHESIA:    Epidural ESTIMATED BLOOD LOSS: See anethesia record, <500cc   SPECIMENS: Placenta for pathology COMPLICATIONS: None immediate   PROCEDURE IN DETAIL:     The patient received intravenous antibiotics (2g Ancef ) and had sequential compression devices applied to her lower extremities while in the preoperative area.  She was then taken to the operating room where epidural anesthesia was dosed  up to surgical level and was found to be adequate. She was then placed in a dorsal supine position with a leftward tilt, and prepped and draped in a sterile manner.  A foley catheter was placed into her bladder and attached to constant gravity.  After an adequate timeout was performed, a Pfannenstiel skin incision was made with scalpel and carried through to the underlying layer of fascia. The fascia was incised in the midline and this incision was extended bilaterally using the Mayo scissors. Kocher clamps were applied to the superior aspect of the fascial incision and the underlying rectus muscles were dissected off bluntly. A similar process was carried out on the inferior aspect of the facial incision. The rectus muscles were separated in the midline bluntly and the peritoneum was entered bluntly.  A bladder flap was created sharply and developed bluntly. A transverse hysterotomy was made with a scalpel and extended bilaterally bluntly. The bladder blade was then removed. The infant was successfully delivered, and cord was clamped and cut and infant was handed over to awaiting neonatology team. Uterine massage was then administered and the placenta delivered intact with three-vessel cord. Cord gases were taken. The uterus was cleared of clot and debris.  The hysterotomy was closed with 0 vicryl.  A second imbricating suture of 0-vicryl was used to reinforce the incision and aid in hemostasis. A bilateral tubal ligation was performed via cautery with a ligasure in the usual fashion.  Good hemostasis was noted before and after uterus and fallopian tubes placed back into abdomen. The fascia was closed with 0-Vicryl in a running fashion with good restoration of anatomy.  The subcutaneus tissue was irrigated and hemostatic. The skin was closed with 4-0 Vicryl in a subcuticular fashion.  All surgical site and was hemostatic at end of procedure without any further bleeding on exam.    Pt tolerated the procedure  well. All sponge/lap/needle counts were correct  X 2. Pt taken to recovery room in stable condition.     Kelly Milian MD

## 2024-03-27 NOTE — Transfer of Care (Signed)
 Immediate Anesthesia Transfer of Care Note  Patient: Alison Harrison  Procedure(s) Performed: CESAREAN SECTION, WITH BILATERAL TUBAL LIGATION (Abdomen)  Patient Location: PACU  Anesthesia Type:Spinal  Level of Consciousness: awake, alert , and oriented  Airway & Oxygen Therapy: Patient Spontanous Breathing  Post-op Assessment: Report given to RN and Post -op Vital signs reviewed and stable  Post vital signs: Reviewed and stable  Last Vitals:  Vitals Value Taken Time  BP 110/74 03/27/24 08:43  Temp    Pulse 65 03/27/24 08:44  Resp 14 03/27/24 08:44  SpO2 95 % 03/27/24 08:44  Vitals shown include unfiled device data.  Last Pain:  Vitals:   03/27/24 0548  PainSc: 0-No pain         Complications: No notable events documented.

## 2024-03-27 NOTE — Anesthesia Procedure Notes (Signed)
 Spinal  Patient location during procedure: OR Start time: 03/27/2024 7:25 AM End time: 03/27/2024 7:29 AM Reason for block: surgical anesthesia  Staffing Performed: anesthesiologist  Authorized by: Jerrye Sharper, MD   Performed by: Jerrye Sharper, MD  Preanesthetic Checklist Completed: patient identified, IV checked, site marked, risks and benefits discussed, surgical consent, monitors and equipment checked, pre-op evaluation and timeout performed Spinal Block Patient position: sitting Prep: DuraPrep and site prepped and draped Patient monitoring: heart rate, cardiac monitor, continuous pulse ox and blood pressure Approach: midline Location: L4-5 Injection technique: single-shot Needle Needle type: Pencan  Needle gauge: 24 G Needle length: 9 cm Needle insertion depth (cm): 5.5 Assessment Sensory level: T4 Events: CSF return  Additional Notes Patient tolerated procedure well. Adequate sensory level.

## 2024-03-27 NOTE — Anesthesia Postprocedure Evaluation (Signed)
"   Anesthesia Post Note  Patient: Alison Harrison  Procedure(s) Performed: CESAREAN SECTION, WITH BILATERAL TUBAL LIGATION (Abdomen)     Patient location during evaluation: PACU Anesthesia Type: Spinal Level of consciousness: oriented and awake and alert Pain management: pain level controlled Vital Signs Assessment: post-procedure vital signs reviewed and stable Respiratory status: spontaneous breathing, respiratory function stable and nonlabored ventilation Cardiovascular status: blood pressure returned to baseline and stable Postop Assessment: no headache, no backache, no apparent nausea or vomiting, spinal receding and patient able to bend at knees Anesthetic complications: no   No notable events documented.  Last Vitals:  Vitals:   03/27/24 0945 03/27/24 1044  BP: 107/61 122/82  Pulse: (!) 50 (!) 53  Resp:    Temp: 36.5 C 36.8 C  SpO2: 98% 99%    Last Pain:  Vitals:   03/27/24 1044  TempSrc: Oral  PainSc:    Pain Goal:                   Alison Zumstein A.      "

## 2024-03-28 LAB — CBC
HCT: 29.1 % — ABNORMAL LOW (ref 36.0–46.0)
Hemoglobin: 10 g/dL — ABNORMAL LOW (ref 12.0–15.0)
MCH: 31.6 pg (ref 26.0–34.0)
MCHC: 34.4 g/dL (ref 30.0–36.0)
MCV: 92.1 fL (ref 80.0–100.0)
Platelets: 109 K/uL — ABNORMAL LOW (ref 150–400)
RBC: 3.16 MIL/uL — ABNORMAL LOW (ref 3.87–5.11)
RDW: 12.1 % (ref 11.5–15.5)
WBC: 8.1 K/uL (ref 4.0–10.5)
nRBC: 0 % (ref 0.0–0.2)

## 2024-03-28 MED ORDER — BENZONATATE 100 MG PO CAPS
100.0000 mg | ORAL_CAPSULE | Freq: Three times a day (TID) | ORAL | Status: DC | PRN
  Administered 2024-03-28: 100 mg via ORAL
  Filled 2024-03-28 (×2): qty 1

## 2024-03-28 NOTE — Progress Notes (Signed)
 Subjective: Postpartum Day 1: Cesarean Delivery Patient reports tolerating PO and no problems voiding.    Objective: Vital signs in last 24 hours: Temp:  [97.5 F (36.4 C)-98.7 F (37.1 C)] 97.7 F (36.5 C) (01/23 0615) Pulse Rate:  [50-66] 54 (01/23 0615) Resp:  [11-18] 16 (01/23 0615) BP: (104-123)/(61-82) 104/67 (01/23 0615) SpO2:  [93 %-99 %] 97 % (01/23 0615)  Physical Exam:  General: alert and cooperative Lochia: appropriate Uterine Fundus: firm Incision: healing well DVT Evaluation: No evidence of DVT seen on physical exam.  Recent Labs    03/25/24 0903 03/28/24 0455  HGB 11.9* 10.0*  HCT 35.3* 29.1*    Assessment/Plan: Status post Cesarean section. Doing well postoperatively.  Continue current care.  Kelly Delon Milian, MD 03/28/2024, 8:57 AM

## 2024-03-29 MED ORDER — BENZONATATE 100 MG PO CAPS: 100.0000 mg | ORAL_CAPSULE | Freq: Three times a day (TID) | ORAL | 0 refills | Status: AC | PRN

## 2024-03-29 MED ORDER — OXYCODONE HCL 5 MG PO TABS: 5.0000 mg | ORAL_TABLET | ORAL | 0 refills | Status: AC | PRN

## 2024-03-29 MED ORDER — IBUPROFEN 600 MG PO TABS: 600.0000 mg | ORAL_TABLET | Freq: Four times a day (QID) | ORAL | 0 refills | Status: AC | PRN

## 2024-03-29 MED ORDER — DOCUSATE SODIUM 100 MG PO CAPS: 100.0000 mg | ORAL_CAPSULE | Freq: Two times a day (BID) | ORAL | 2 refills | Status: AC

## 2024-03-29 NOTE — Lactation Note (Signed)
 This note was copied from a baby's chart. Lactation Consultation Note  Patient Name: Girl Ellory Khurana Unijb'd Date: 03/29/2024 Age:35 hours Reason for consult: Follow-up assessment;Infant weight loss;Term (per mom baby is latching well with swallows. Mom requested a review of engorgement. LC reviewed engorgement prevention and tx, nutritive vs non- nutritive sucking patterns and watch for hanging latched. S/C mastitis. what to call for Mom and baby)   Maternal Data Has patient been taught Hand Expression?: Yes Does the patient have breastfeeding experience prior to this delivery?: Yes  Feeding Mother's Current Feeding Choice: Breast Milk  LATCH Score - 9    Lactation Tools Discussed/Used  Per mom has a hand pump and DEBP at home   Interventions Interventions: Breast feeding basics reviewed;Education;LC Services brochure;CDC milk storage guidelines;CDC Guidelines for Breast Pump Cleaning  Discharge Discharge Education: Engorgement and breast care;Warning signs for feeding baby Pump: DEBP;Manual;Personal WIC Program: No  Consult Status Consult Status: Complete Date: 03/29/24    Rollene Jenkins Fiedler 03/29/2024, 10:33 AM

## 2024-03-29 NOTE — Discharge Summary (Signed)
 "    Postpartum Discharge Summary  Date of Service updated 03/29/24     Patient Name: Alison Harrison DOB: 02-28-1991 MRN: [id number]  Date of admission: 03/27/2024 Delivery date:03/27/2024 Delivering provider: MARNE KELLY NEST Date of discharge: 03/29/2024  Admitting diagnosis: History of cesarean delivery [Z98.891] Pregnancy [Z34.90] Intrauterine pregnancy: [redacted]w[redacted]d     Secondary diagnosis:  Active Problems:   Pregnancy  Additional problems: None    Discharge diagnosis: Term Pregnancy Delivered                                              Post partum procedures:None Augmentation: N/A Complications: None  Hospital course: Sceduled C/S   34 y.o. yo [id number] at [redacted]w[redacted]d was admitted to the hospital 03/27/2024 for scheduled cesarean section with the following indication:Elective Repeat.Delivery details are as follows:  Membrane Rupture Time/Date: 7:56 AM,03/27/2024  Delivery Method:C-Section, Low Transverse Operative Delivery:N/A Details of operation can be found in separate operative note.  Patient had a postpartum course complicated by none.  She is ambulating, tolerating a regular diet, passing flatus, and urinating well. Patient is discharged home in stable condition on  03/29/24        Newborn Data: Birth date:03/27/2024 Birth time:7:56 AM Gender:Female Living status:Living Apgars:8 ,9  Weight:3130 g    Magnesium Sulfate received: No BMZ received: No Rhophylac:N/A MMR:N/A T-DaP:Given prenatally  Immunizations administered: Immunization History  Administered Date(s) Administered   DTaP 05/29/1989, 07/26/1989, 11/16/1989, 11/21/1990, 06/08/1994   HIB (PRP-OMP) 05/29/1989, 07/26/1989, 11/16/1989, 07/31/1990   Hepatitis A 09/09/2007   Hepatitis B 12/12/2000, 01/22/2001, 06/17/2001   Hpv-Unspecified 09/09/2007   IPV 05/29/1989, 07/26/1989, 11/16/1989, 06/08/1994   Influenza Inj Mdck Quad Pf 12/09/2021   Influenza,inj,Quad PF,6+ Mos 12/18/2018   MMR 07/31/1990,  06/08/1994   Meningococcal Conjugate 12/30/2003   PPD Test 10/19/2017   Td 01/22/2001   Tdap 10/18/2018, 06/28/2020    Physical exam  Vitals:   03/28/24 1445 03/28/24 2023 03/29/24 0145 03/29/24 0550  BP: 110/65 108/76 124/84 123/86  Pulse: 62 73 71 68  Resp: 16 18  18   Temp: 98.7 F (37.1 C) 98.2 F (36.8 C)  98.1 F (36.7 C)  TempSrc: Oral Axillary  Oral  SpO2: 99% 98%    Weight:      Height:       General: alert and cooperative Lochia: appropriate Uterine Fundus: firm Incision: Healing well with no significant drainage DVT Evaluation: No evidence of DVT seen on physical exam. Labs: Lab Results  Component Value Date   WBC 8.1 03/28/2024   HGB 10.0 (L) 03/28/2024   HCT 29.1 (L) 03/28/2024   MCV 92.1 03/28/2024   PLT 109 (L) 03/28/2024      Latest Ref Rng & Units 12/12/2021    7:32 AM  CMP  Glucose 70 - 99 mg/dL 81   BUN 6 - 23 mg/dL 13   Creatinine 9.59 - 1.20 mg/dL 9.28   Sodium 864 - 854 mEq/L 141   Potassium 3.5 - 5.1 mEq/L 4.0   Chloride 96 - 112 mEq/L 106   CO2 19 - 32 mEq/L 28   Calcium 8.4 - 10.5 mg/dL 9.1   Total Protein 6.0 - 8.3 g/dL 6.5   Total Bilirubin 0.2 - 1.2 mg/dL 0.4   Alkaline Phos 39 - 117 U/L 48   AST 0 - 37 U/L 16   ALT 0 - 35  U/L 15    Edinburgh Score:    03/28/2024   12:30 AM  Edinburgh Postnatal Depression Scale Screening Tool  I have been able to laugh and see the funny side of things. 0  I have looked forward with enjoyment to things. 0  I have blamed myself unnecessarily when things went wrong. 1  I have been anxious or worried for no good reason. 2  I have felt scared or panicky for no good reason. 1  Things have been getting on top of me. 1  I have been so unhappy that I have had difficulty sleeping. 0  I have felt sad or miserable. 0  I have been so unhappy that I have been crying. 0  The thought of harming myself has occurred to me. 0  Edinburgh Postnatal Depression Scale Total 5      After visit meds:   Allergies as of 03/29/2024       Reactions   Tetracycline Hives   Sob        Medication List     TAKE these medications    albuterol  108 (90 Base) MCG/ACT inhaler Commonly known as: VENTOLIN  HFA Inhale 2 puffs into the lungs every 6 (six) hours as needed for wheezing or shortness of breath.   azelastine  0.1 % nasal spray Commonly known as: ASTELIN  Place 2 sprays into both nostrils 2 (two) times daily. Use in each nostril as directed   benzonatate  100 MG capsule Commonly known as: TESSALON  Take 1 capsule (100 mg total) by mouth 3 (three) times daily as needed for cough.   docusate sodium  100 MG capsule Commonly known as: Colace Take 1 capsule (100 mg total) by mouth 2 (two) times daily.   EPINEPHrine  0.3 mg/0.3 mL Soaj injection Commonly known as: EPI-PEN Inject 0.3 mg into the muscle as needed for anaphylaxis.   famotidine  40 MG tablet Commonly known as: PEPCID  Take 1 tablet (40 mg total) by mouth 2 (two) times daily.   fluticasone  50 MCG/ACT nasal spray Commonly known as: FLONASE  Place 2 sprays into both nostrils 2 (two) times daily. What changed:  when to take this reasons to take this   ibuprofen  600 MG tablet Commonly known as: ADVIL  Take 1 tablet (600 mg total) by mouth every 6 (six) hours as needed.   levocetirizine 5 MG tablet Commonly known as: XYZAL  Take 1 tablet (5 mg total) by mouth in the morning and at bedtime.   oxyCODONE  5 MG immediate release tablet Commonly known as: Oxy IR/ROXICODONE  Take 1 tablet (5 mg total) by mouth every 4 (four) hours as needed for severe pain (pain score 7-10).   prenatal multivitamin Tabs tablet Take 1 tablet by mouth daily at 12 noon.         Discharge home in stable condition Infant Feeding: Breast Infant Disposition:home with mother Discharge instruction: per After Visit Summary and Postpartum booklet. Activity: Advance as tolerated. Pelvic rest for 6 weeks.  Diet: routine diet Anticipated Birth  Control: BTL done at time of CS Postpartum Appointment:6 weeks Additional Postpartum F/U: None Future Appointments:No future appointments. Follow up Visit:      03/29/2024 Kelly Delon Milian, MD   "

## 2024-03-31 ENCOUNTER — Telehealth: Payer: Self-pay | Admitting: *Deleted

## 2024-03-31 LAB — SURGICAL PATHOLOGY

## 2024-03-31 NOTE — Transitions of Care (Post Inpatient/ED Visit) (Signed)
 "  03/31/2024  Name: Alison Harrison MRN: [id number] DOB: 1990/08/02  Today's TOC FU Call Status: Today's TOC FU Call Status:: Successful TOC FU Call Completed TOC FU Call Complete Date: 03/31/24  Patient's Name and Date of Birth confirmed. DOB, Name  Transition Care Management Follow-up Telephone Call Date of Discharge: 03/29/24 Discharge Facility: [facility]) Type of Discharge: Inpatient Admission Primary Inpatient Discharge Diagnosis:: History of cesarean delivery How have you been since you were released from the hospital?: Better Any questions or concerns?: No  Items Reviewed: Did you receive and understand the discharge instructions provided?: Yes Medications obtained,verified, and reconciled?: Yes (Medications Reviewed) Any new allergies since your discharge?: No Dietary orders reviewed?: Yes Type of Diet Ordered:: regular Do you have support at home?: Yes People in Home [RPT]: spouse, parent(s) Name of Support/Comfort Primary Source: Alison Harrison (spouse)  Medications Reviewed Today: Medications Reviewed Today     Reviewed by Alison Alison BIRCH, RN (Registered Nurse) on 03/31/24 at 1042  Med List Status: <None>   Medication Order Taking? Sig Documenting Provider Last Dose Status Informant  acetaminophen  (TYLENOL ) 500 MG tablet [id number] Yes Take 500 mg by mouth every 6 (six) hours as needed. [provider]  Active   albuterol  (VENTOLIN  HFA) 108 (90 Base) MCG/ACT inhaler [id number] Yes Inhale 2 puffs into the lungs every 6 (six) hours as needed for wheezing or shortness of breath. [provider]  Active Self  azelastine  (ASTELIN ) 0.1 % nasal spray [id number]  Place 2 sprays into both nostrils 2 (two) times daily. Use in each nostril as directed  Patient not taking: Reported on 12/18/2023   Alison Merle, MD  Active Self  benzonatate  (TESSALON ) 100 MG capsule [id number] Yes Take 1 capsule (100 mg total) by mouth 3 (three) times daily as needed for cough.  Alison Kelly Nest, MD  Active   docusate sodium  (COLACE) 100 MG capsule [id number]  Take 1 capsule (100 mg total) by mouth 2 (two) times daily.  Patient not taking: Reported on 03/31/2024   Alison Kelly Nest, MD  Active   EPINEPHrine  0.3 mg/0.3 mL IJ SOAJ injection [id number] Yes Inject 0.3 mg into the muscle as needed for anaphylaxis. Alison Merle, MD  Active Self  famotidine  (PEPCID ) 40 MG tablet [id number] Yes Take 1 tablet (40 mg total) by mouth 2 (two) times daily. [name], Alison SAILOR, MD  Active Self  fluticasone  (FLONASE ) 50 MCG/ACT nasal spray [id number] Yes Place 2 sprays into both nostrils 2 (two) times daily.  Patient taking differently: Place 2 sprays into both nostrils 2 (two) times daily as needed for allergies or rhinitis.   Alison Merle, MD  Active Self  ibuprofen  (ADVIL ) 600 MG tablet [id number] Yes Take 1 tablet (600 mg total) by mouth every 6 (six) hours as needed. Alison Kelly Nest, MD  Active   levocetirizine (XYZAL ) 5 MG tablet [id number] Yes Take 1 tablet (5 mg total) by mouth in the morning and at bedtime. [name], Alison SAILOR, MD  Active Self  oxyCODONE  (OXY IR/ROXICODONE ) 5 MG immediate release tablet [id number] Yes Take 1 tablet (5 mg total) by mouth every 4 (four) hours as needed for severe pain (pain score 7-10). Alison Kelly Nest, MD  Active   polyethylene glycol (MIRALAX / GLYCOLAX) 17 g packet [id number] Yes Take 17 g by mouth daily as needed for mild constipation. [provider]  Active   Prenatal Vit-Fe Fumarate-FA (PRENATAL MULTIVITAMIN) TABS tablet [id number] Yes Take 1 tablet by mouth daily at 12 noon. [provider]  Active Self            Home Care and Equipment/Supplies: Were Home Health Services Ordered?: No Any new equipment or medical supplies ordered?: No  Functional Questionnaire: Do you need assistance with bathing/showering or dressing?: No Do you need assistance with meal preparation?: Yes (Assistance with  this task) Do you need assistance with eating?: No Do you have difficulty maintaining continence: No Do you need assistance with getting out of bed/getting out of a chair/moving?: No Do you have difficulty managing or taking your medications?: No  Follow up appointments reviewed: PCP Follow-up appointment confirmed?: No (Patient will follow up with [facility] and schedule her post op hospital visit with a new provider with that office today.) MD Provider Line [phone] Given: Yes Specialist Hospital Follow-up appointment confirmed?: Yes Date of Specialist follow-up appointment?:  (Patient reports it is a set appointment but can't remember the days. She will contact the office and verify the date. States it is in 6 weeks.) Follow-Up Specialty Provider:: Alison, Kelly Nest, MD ([facility]) Do you need transportation to your follow-up appointment?: No Do you understand care options if your condition(s) worsen?: Yes-patient verbalized understanding  SDOH Interventions Today    Flowsheet Row Most Recent Value  SDOH Interventions   Food Insecurity Interventions Intervention Not Indicated  Housing Interventions Intervention Not Indicated  Transportation Interventions Intervention Not Indicated  Utilities Interventions Intervention Not Indicated   Discussed and offered 30 day TOC program.  Patient  opt to decline TOC program and services at this time. The patient has been provided with contact information for the care management team and has been advised to call with any health -related questions or concerns.  The patient verbalized understanding with current plan of care.  The patient is directed to their insurance card regarding availability of benefits coverage.    Alison Ku, RN, BSN Al[facility]  [facility], [facility] RN Care Manager Direct Dial: [phone]  Fax: [phone]   "

## 2024-03-31 NOTE — Patient Instructions (Addendum)
 Visit Information  Thank you for taking time to visit with me today. Please don't hesitate to contact me if additional assistance is needed.  Patient Instructions: -Notify provider with any symptoms or signs or infection. -Follow up with your OB-GYN provider and verify upcoming appointment -Call to secure a primary care provider [phone]     Care plan and visit instructions communicated with the patient verbally today. Patient agrees to receive a copy in MyChart. Active MyChart status and patient understanding of how to access instructions and care plan via MyChart confirmed with patient.     No further follow up required: Patient opt to declined TOC services/program at this time.  Please call the care guide team at [phone] if you need to cancel or reschedule your appointment.   Please call the Suicide and Crisis Lifeline: 988 call the [place]  National Suicide Prevention Lifeline: 617 291 3430 or TTY: 757-150-5008 TTY (737) 306-8975) to talk to a trained counselor call 1-800-273-TALK (toll free, 24 hour hotline) if you are experiencing a Mental Health or Behavioral Health Crisis or need someone to talk to.   Olam Ku, RN, BSN [place]  [facility], [facility] RN Care Manager Direct Dial: [phone]  Fax: [phone]

## 2024-04-04 ENCOUNTER — Telehealth (HOSPITAL_COMMUNITY): Payer: Self-pay | Admitting: *Deleted

## 2024-04-04 NOTE — Telephone Encounter (Signed)
 04/04/2024  Name: Alison Harrison MRN: [id number] DOB: Jul 20, 1990  Reason for Call:  Transition of Care Hospital Discharge Call  Contact Status: Patient Contact Status: Message  Language assistant needed:          Follow-Up Questions:    Van Postnatal Depression Scale:  In the Past 7 Days:    PHQ2-9 Depression Scale:     Discharge Follow-up:    Post-discharge interventions: NA  Mliss Sieve, RN 04/04/2024 14:08

## 2024-04-05 ENCOUNTER — Encounter (HOSPITAL_COMMUNITY): Payer: Self-pay

## 2024-04-05 ENCOUNTER — Observation Stay
Admission: EM | Admit: 2024-04-05 | Discharge: 2024-04-06 | Disposition: A | Attending: Obstetrics and Gynecology | Admitting: Obstetrics and Gynecology

## 2024-04-05 DIAGNOSIS — O165 Unspecified maternal hypertension, complicating the puerperium: Principal | ICD-10-CM | POA: Diagnosis present

## 2024-04-05 DIAGNOSIS — O9089 Other complications of the puerperium, not elsewhere classified: Secondary | ICD-10-CM | POA: Diagnosis not present

## 2024-04-05 DIAGNOSIS — R748 Abnormal levels of other serum enzymes: Secondary | ICD-10-CM | POA: Insufficient documentation

## 2024-04-05 LAB — CBC
HCT: 35.8 % — ABNORMAL LOW (ref 36.0–46.0)
Hemoglobin: 12.1 g/dL (ref 12.0–15.0)
MCH: 31.3 pg (ref 26.0–34.0)
MCHC: 33.8 g/dL (ref 30.0–36.0)
MCV: 92.7 fL (ref 80.0–100.0)
Platelets: 232 10*3/uL (ref 150–400)
RBC: 3.86 MIL/uL — ABNORMAL LOW (ref 3.87–5.11)
RDW: 11.9 % (ref 11.5–15.5)
WBC: 5.1 10*3/uL (ref 4.0–10.5)
nRBC: 0 % (ref 0.0–0.2)

## 2024-04-05 LAB — PROTEIN / CREATININE RATIO, URINE
Creatinine, Urine: 13 mg/dL
Total Protein, Urine: <6 mg/dL

## 2024-04-05 LAB — COMPREHENSIVE METABOLIC PANEL WITH GFR
ALT: 167 U/L — ABNORMAL HIGH (ref 0–44)
AST: 59 U/L — ABNORMAL HIGH (ref 15–41)
Albumin: 3.7 g/dL (ref 3.5–5.0)
Alkaline Phosphatase: 106 U/L (ref 38–126)
Anion gap: 11 (ref 5–15)
BUN: 19 mg/dL (ref 6–20)
CO2: 26 mmol/L (ref 22–32)
Calcium: 8.9 mg/dL (ref 8.9–10.3)
Chloride: 103 mmol/L (ref 98–111)
Creatinine, Ser: 0.67 mg/dL (ref 0.44–1.00)
GFR, Estimated: >60 mL/min
Glucose, Bld: 87 mg/dL (ref 70–99)
Potassium: 3.7 mmol/L (ref 3.5–5.1)
Sodium: 140 mmol/L (ref 135–145)
Total Bilirubin: <0.2 mg/dL (ref 0.0–1.2)
Total Protein: 6.3 g/dL — ABNORMAL LOW (ref 6.5–8.1)

## 2024-04-05 MED ORDER — LABETALOL HCL 5 MG/ML IV SOLN: 20.0000 mg | INTRAVENOUS | Status: DC | PRN

## 2024-04-05 MED ORDER — LABETALOL HCL 5 MG/ML IV SOLN: 80.0000 mg | INTRAVENOUS | Status: DC | PRN

## 2024-04-05 MED ORDER — HYDRALAZINE HCL 20 MG/ML IJ SOLN: 10.0000 mg | INTRAMUSCULAR | Status: DC | PRN

## 2024-04-05 MED ORDER — LABETALOL HCL 5 MG/ML IV SOLN: 40.0000 mg | INTRAVENOUS | Status: DC | PRN

## 2024-04-05 NOTE — ED Triage Notes (Addendum)
 Pt to ED via POV from home. Pt is 1wk PP via c-section. Pt reports HA that started yesterday. Pt reports no relief with tylenol  and HA has been intermittent. Denies vision changes. Normal pregnancy. Hypertensive in triage SBP 150s. L&D called   Pt seen at [facility] [place]

## 2024-04-05 NOTE — H&P (Signed)
 OB History & Physical   History of Present Illness:  Chief Complaint: elevated blood pressure at home.  HPI:  Carliyah Cotterman is a 34 y.o. [phone] female who is postop day #9 from a repeat cesarean delivery and BTL.  Her pregnancy was complicated by [facility] and history of cesarean delivery.  It appears she has a history of SVD, but had her first cesarean delivery in the setting of a presumed amniotic fluid embolism.    She has been checking her BP at home and getting some slightly elevated numbers. She does report that the highest number she got was 150s/104.  She has had intermittent headache that resolved.  She denies one currently.  She denies visual changes and RUQ pain.  She has no history of elevated LFTs.  She states that since her c-section she has been taking intermittent ibuprofen  and tylenol .  Denies other symptoms. She is breastfeeding.     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/10/2021   Multiple nevi 07/31/2017   Pneumonia 06/2017   Rash and nonspecific skin eruption 06/17/2022   Tick bite    Tricuspid regurgitation    Viral upper respiratory tract infection 01/12/2021    Past Surgical History:  Procedure Laterality Date   CESAREAN SECTION N/A 09/23/2020   Procedure: CESAREAN SECTION;  Surgeon: Marne Kelly Nest, MD;  Location: [facility] LD ORS;  Service: Obstetrics;  Laterality: N/A;   CESAREAN SECTION WITH BILATERAL TUBAL LIGATION N/A 03/27/2024    Procedure: CESAREAN SECTION, WITH BILATERAL TUBAL LIGATION;  Surgeon: Marne Kelly Nest, MD;  Location: [facility] LD ORS;  Service: Obstetrics;  Laterality: N/A;   WISDOM TOOTH EXTRACTION      Allergies[1]  Prior to Admission medications  Medication Sig Start Date End Date Taking? Authorizing Provider  acetaminophen  (TYLENOL ) 500 MG tablet Take 500 mg by mouth every 6 (six) hours as needed.    [provider]  albuterol  (VENTOLIN  HFA) 108 (90 Base) MCG/ACT inhaler Inhale 2 puffs into the lungs every 6 (six) hours as needed for wheezing or shortness of breath.    [provider]  azelastine  (ASTELIN ) 0.1 % nasal spray Place 2 sprays into both nostrils 2 (two) times daily. Use in each nostril as directed Patient not taking: Reported on 12/18/2023 04/19/23   Hope Merle, MD  benzonatate  (TESSALON ) 100 MG capsule Take 1 capsule (100 mg total) by mouth 3 (three) times daily as needed for cough. 03/29/24   Marne Kelly Nest, MD  docusate sodium  (COLACE) 100 MG capsule Take 1 capsule (100 mg total) by mouth 2 (two) times daily. Patient not taking: Reported on 03/31/2024 03/29/24   Marne Kelly Nest, MD  EPINEPHrine  0.3 mg/0.3 mL IJ SOAJ injection Inject 0.3 mg into the muscle as needed for anaphylaxis. 04/19/23   Hope Merle, MD  famotidine  (PEPCID ) 40 MG tablet Take 1 tablet (40 mg total) by mouth  2 (two) times daily. 12/09/21   [name], Randine SAILOR, MD  fluticasone  (FLONASE ) 50 MCG/ACT nasal spray Place 2 sprays into both nostrils 2 (two) times daily. Patient taking differently: Place 2 sprays into both nostrils 2 (two) times daily as needed for allergies or rhinitis. 04/19/23   Hope Merle, MD  ibuprofen  (ADVIL ) 600 MG tablet Take 1 tablet (600 mg total) by mouth every 6 (six) hours as needed. 03/29/24   Marne Kelly Nest, MD  levocetirizine (XYZAL ) 5 MG tablet Take 1 tablet (5 mg total) by mouth in the morning and at bedtime. 12/09/21   [name], Randine SAILOR, MD   oxyCODONE  (OXY IR/ROXICODONE ) 5 MG immediate release tablet Take 1 tablet (5 mg total) by mouth every 4 (four) hours as needed for severe pain (pain score 7-10). 03/29/24   Marne Kelly Nest, MD  polyethylene glycol (MIRALAX / GLYCOLAX) 17 g packet Take 17 g by mouth daily as needed for mild constipation.    [provider]  Prenatal Vit-Fe Fumarate-FA (PRENATAL MULTIVITAMIN) TABS tablet Take 1 tablet by mouth daily at 12 noon.    [provider]    OB History  Gravida Para Term Preterm AB Living  7 3 3  4 3   SAB IAB Ectopic Multiple Live Births  4   0 3    # Outcome Date GA Lbr Len/2nd Weight Sex Type Anes PTL Lv  7 Term 03/27/24 [redacted]w[redacted]d  3130 g F CS-LTranv Spinal  LIV  6 Term 09/23/20 [redacted]w[redacted]d  3368 g M CS-LTranv EPI, Gen  LIV  5 Term 01/17/19 [redacted]w[redacted]d 11:47 / 02:28 3504 g M Vag-Spont EPI  LIV     Birth Comments: wnl  4 SAB 03/2018          3 SAB 12/2017          2 SAB           1 SAB             Prenatal care site: [facility] [place]  Social History: She  reports that she has never smoked. She has never used smokeless tobacco. She reports that she does not currently use alcohol. She reports that she does not use drugs.  Family History: family history includes Alcohol abuse in her maternal grandfather; Arthritis in her maternal grandmother; Cancer in her paternal grandfather and paternal grandmother; Diabetes in her maternal grandfather; Hypertension in her mother; Miscarriages / Stillbirths in her sister.   Review of Systems  Constitutional: Negative.   HENT: Negative.    Eyes: Negative.  Negative for blurred vision and double vision.  Respiratory: Negative.    Cardiovascular: Negative.   Gastrointestinal: Negative.  Negative for abdominal pain.  Genitourinary: Negative.   Musculoskeletal: Negative.   Skin: Negative.   Neurological: Negative.  Negative for headaches (current).  Psychiatric/Behavioral: Negative.       Physical Exam:  BP  127/79   Pulse 61   Temp 99.7 F (37.6 C) (Oral)   Resp 16   LMP 06/23/2023   BP Range:  Admission: 158/96 140/75, 121/84, 121/74, 118/81, 120/73, 127/79 Physical Exam Constitutional:      General: She is not in acute distress.    Appearance: Normal appearance.  HENT:     Head: Normocephalic and atraumatic.  Eyes:     General: No scleral icterus.    Conjunctiva/sclera: Conjunctivae normal.  Neurological:     General: No focal deficit present.     Mental Status: She is alert and  oriented to person, place, and time.     Cranial Nerves: No cranial nerve deficit.  Psychiatric:        Mood and Affect: Mood normal.        Behavior: Behavior normal.        Judgment: Judgment normal.    Lab Results  Component Value Date   WBC 5.1 04/05/2024   HGB 12.1 04/05/2024   HCT 35.8 (L) 04/05/2024   PLT 232 04/05/2024   CREATININE 0.67 04/05/2024   ALT 167 (H) 04/05/2024   AST 59 (H) 04/05/2024   PROTCRRATIO NOT CALCULATED 04/05/2024       Assessment:  Ladana Chavero is a 34 y.o. [phone] female who is postop day #9 s/p repeat c-section/BTL with elevated blood pressure at home, some early elevated blood pressure here that seems to have normalized over time.  She has elevated ALT and mildly elevated AST, but otherwise no lab abnormalities.  Discussed findings.  Technically, this would qualify her for preeclampsia with severe features, if we had more elevated blood pressure.    Plan:  Monitor BPs Trends LFTs. If downtrending, have her follow up as outpatient.    Garnette Mace, MD 04/05/2024 9:24 PM        [1]  Allergies Allergen Reactions   Tetracycline Hives    Sob

## 2024-04-05 NOTE — OB Triage Note (Signed)
 Pt arrived from home with c/o headache and elevated blood pressure. SVD on 1/22 at [facility] without complications. Pt states no blood pressure issues during pregnancy. She called and spoke to her OB today and was told to be seen. Pt denies blurry vision or epigastric pain. States her headache was worst yesterday but still present today. On arrival to triage she denies headache. States her sister gave her a BP cuff and she has had some readings 140/90 and 150/95. Dr Leonce aware and orders received.

## 2024-04-06 LAB — COMPREHENSIVE METABOLIC PANEL WITH GFR
ALT: 147 U/L — ABNORMAL HIGH (ref 0–44)
AST: 52 U/L — ABNORMAL HIGH (ref 15–41)
Albumin: 3.5 g/dL (ref 3.5–5.0)
Alkaline Phosphatase: 104 U/L (ref 38–126)
Anion gap: 10 (ref 5–15)
BUN: 17 mg/dL (ref 6–20)
CO2: 24 mmol/L (ref 22–32)
Calcium: 8.6 mg/dL — ABNORMAL LOW (ref 8.9–10.3)
Chloride: 104 mmol/L (ref 98–111)
Creatinine, Ser: 0.64 mg/dL (ref 0.44–1.00)
GFR, Estimated: >60 mL/min
Glucose, Bld: 91 mg/dL (ref 70–99)
Potassium: 3.6 mmol/L (ref 3.5–5.1)
Sodium: 139 mmol/L (ref 135–145)
Total Bilirubin: 0.3 mg/dL (ref 0.0–1.2)
Total Protein: 6.2 g/dL — ABNORMAL LOW (ref 6.5–8.1)

## 2024-04-06 NOTE — Discharge Summary (Signed)
 Physician Discharge Summary  Patient ID: Alison Harrison MRN: [id number] DOB/AGE: 590-May-1992 34 y.o.  Admit date: 04/05/2024 Admitting provider: Garnette JONETTA Mace, MD Discharge date: 04/06/2024   Admission Diagnoses:  1) Elevated blood pressure at home 2) Postpartum day #9 from repeat c-section/BTL  Discharge Diagnoses:  Principal Problem:   Postpartum hypertension  Elevated liver enzymes, resolving  History of Present Illness: Alison Harrison is a 34 y.o. [phone] female who is postop day #9 from a repeat cesarean delivery and BTL.  Her pregnancy was complicated by [facility] and history of cesarean delivery.  It appears she has a history of SVD, but had her first cesarean delivery in the setting of a presumed amniotic fluid embolism.     She has been checking her BP at home and getting some slightly elevated numbers. She does report that the highest number she got was 150s/104.  She has had intermittent headache that resolved.  She denies one currently.  She denies visual changes and RUQ pain.  She has no history of elevated LFTs.  She states that since her c-section she has been taking intermittent ibuprofen  and tylenol .  Denies other symptoms. She is breastfeeding.   Past Medical History:  Diagnosis Date   Abrasion of wrist, right    right wrist sting wray laceration right wrist   Allergy    Chicken pox    COVID-19    10/2020, 11/2020   Dislocation of jaw 10/03/2019   Dizziness 12/06/2020   Eustachian tube disorder, bilateral 04/26/2023   Exercise-induced asthma    GERD (gastroesophageal reflux disease)    History of amniotic fluid embolism    Hives 01/10/2021   Increased thirst 07/31/2017   Kidney stones    Mast cell activation syndrome    Milk allergy 12/06/2020   Multiple food allergies 01/10/2021   Multiple nevi 07/31/2017   Pneumonia 06/2017   Rash and nonspecific skin eruption 06/17/2022   Tick bite    Tricuspid regurgitation    Viral upper respiratory tract  infection 01/12/2021    Past Surgical History:  Procedure Laterality Date   CESAREAN SECTION N/A 09/23/2020   Procedure: CESAREAN SECTION;  Surgeon: Marne Kelly Nest, MD;  Location: [facility] LD ORS;  Service: Obstetrics;  Laterality: N/A;   CESAREAN SECTION WITH BILATERAL TUBAL LIGATION N/A 03/27/2024   Procedure: CESAREAN SECTION, WITH BILATERAL TUBAL LIGATION;  Surgeon: Marne Kelly Nest, MD;  Location: [facility] OR[facility];  Service: Obstetrics;  Laterality: N/A;   WISDOM TOOTH EXTRACTION      Medications Ordered Prior to Encounter[1]  Allergies[2]  Social History   Socioeconomic History   Marital status: Married    Spouse name: Alison Harrison   Number of children: Not on file   Years of education: Not on file   Highest education level: Not on file  Occupational History   Not on file  Tobacco Use   Smoking status: Never   Smokeless tobacco: Never  Vaping Use   Vaping status: Never Used  Substance and Sexual Activity   Alcohol use: Not Currently   Drug use: Never    Comment: husband    Sexual activity: Yes    Partners: Male    Birth control/protection: None  Other Topics Concern   Not on file  Social History Narrative   Stay at home mom   From [place]    Married 2 sons    Bachelors degree    Used to play volleyballl, Basketball, track    Owns guns, wears  seat belt, safe in relationship    Social Drivers of Health   Tobacco Use: Low Risk (03/27/2024)   Patient History    Smoking Tobacco Use: Never    Smokeless Tobacco Use: Never    Passive Exposure: Not on file  Financial Resource Strain: Not on file  Food Insecurity: No Food Insecurity (03/31/2024)   Epic    Worried About Programme Researcher, Broadcasting/film/video in the Last Year: Never true    Ran Out of Food in the Last Year: Never true  Transportation Needs: No Transportation Needs (03/31/2024)   Epic    Lack of Transportation (Medical): No    Lack of Transportation (Non-Medical): No  Physical Activity: Not on file  Stress: Not on file   Social Connections: Not on file  Intimate Partner Violence: Unknown (03/31/2024)   Epic    Fear of Current or Ex-Partner: No    Emotionally Abused: Not on file    Physically Abused: No    Sexually Abused: No  Depression (PHQ2-9): Low Risk (03/31/2024)   Depression (PHQ2-9)    PHQ-2 Score: 0  Alcohol Screen: Not on file  Housing: Low Risk (03/31/2024)   Epic    Unable to Pay for Housing in the Last Year: No    Number of Times Moved in the Last Year: 0    Homeless in the Last Year: No  Utilities: Not At Risk (03/31/2024)   Epic    Threatened with loss of utilities: No  Health Literacy: Not on file    Family History  Problem Relation Age of Onset   Hypertension Mother    Arthritis Maternal Grandmother    Alcohol abuse Maternal Grandfather    Diabetes Maternal Grandfather    Cancer Paternal Grandmother        breast    Cancer Paternal Grandfather        prostate    Miscarriages / Stillbirths Sister      Review of Systems  Constitutional: Negative.   HENT: Negative.    Eyes: Negative.   Respiratory: Negative.    Cardiovascular: Negative.   Gastrointestinal: Negative.   Genitourinary: Negative.   Musculoskeletal: Negative.   Skin: Negative.   Neurological: Negative.   Psychiatric/Behavioral: Negative.       Physical Exam: BP 121/84   Pulse 63   Temp 99.7 F (37.6 C) (Oral)   Resp 16   LMP 06/23/2023   Physical Exam Constitutional:      General: She is not in acute distress.    Appearance: Normal appearance.  HENT:     Head: Normocephalic and atraumatic.  Eyes:     General: No scleral icterus.    Conjunctiva/sclera: Conjunctivae normal.  Neurological:     General: No focal deficit present.     Mental Status: She is alert and oriented to person, place, and time.     Cranial Nerves: No cranial nerve deficit.  Psychiatric:        Mood and Affect: Mood normal.        Behavior: Behavior normal.        Judgment: Judgment normal.     Consults:  None  Significant Findings/ Diagnostic Studies:  Lab Results  Component Value Date   WBC 5.1 04/05/2024   HGB 12.1 04/05/2024   HCT 35.8 (L) 04/05/2024   PLT 232 04/05/2024   CREATININE 0.64 04/06/2024   ALT 147 (H) 04/06/2024   AST 52 (H) 04/06/2024   PROTCRRATIO NOT CALCULATED 04/05/2024    Initial ALT:  167 -> 147 Initial AST: 59 -> 52  Procedures: None  Hospital Course: The patient was admitted to Labor and Delivery Triage for observation. She had elevated BP initially, which resolved into the normal range while in triage.  She had no severe symptoms.  Her LFTs were initially elevated and began to resolve after a 6-hour recheck.  She has either a resolving severe preeclampsia or a transient elevation in LFTs for an unkonwn cause.  Mutual decision made to forgo magnesium sulfate treatment.  She was discharged in stable condition with strict precautions about repeat in blood pressure elevations, right upper quadrant pain, headaches, or other concerning symptoms.   Discharge Condition: stable  Disposition: Discharge disposition: 01-Home or Self Care       Diet: Regular diet  Discharge Activity: Activity as tolerated   Allergies as of 04/06/2024       Reactions   Tetracycline Hives   Sob        Medication List     ASK your doctor about these medications    acetaminophen  500 MG tablet Commonly known as: TYLENOL  Take 500 mg by mouth every 6 (six) hours as needed.   albuterol  108 (90 Base) MCG/ACT inhaler Commonly known as: VENTOLIN  HFA Inhale 2 puffs into the lungs every 6 (six) hours as needed for wheezing or shortness of breath.   azelastine  0.1 % nasal spray Commonly known as: ASTELIN  Place 2 sprays into both nostrils 2 (two) times daily. Use in each nostril as directed   benzonatate  100 MG capsule Commonly known as: TESSALON  Take 1 capsule (100 mg total) by mouth 3 (three) times daily as needed for cough.   docusate sodium  100 MG capsule Commonly known  as: Colace Take 1 capsule (100 mg total) by mouth 2 (two) times daily.   EPINEPHrine  0.3 mg/0.3 mL Soaj injection Commonly known as: EPI-PEN Inject 0.3 mg into the muscle as needed for anaphylaxis.   famotidine  40 MG tablet Commonly known as: PEPCID  Take 1 tablet (40 mg total) by mouth 2 (two) times daily.   fluticasone  50 MCG/ACT nasal spray Commonly known as: FLONASE  Place 2 sprays into both nostrils 2 (two) times daily.   ibuprofen  600 MG tablet Commonly known as: ADVIL  Take 1 tablet (600 mg total) by mouth every 6 (six) hours as needed.   levocetirizine 5 MG tablet Commonly known as: XYZAL  Take 1 tablet (5 mg total) by mouth in the morning and at bedtime.   oxyCODONE  5 MG immediate release tablet Commonly known as: Oxy IR/ROXICODONE  Take 1 tablet (5 mg total) by mouth every 4 (four) hours as needed for severe pain (pain score 7-10).   polyethylene glycol 17 g packet Commonly known as: MIRALAX / GLYCOLAX Take 17 g by mouth daily as needed for mild constipation.   prenatal multivitamin Tabs tablet Take 1 tablet by mouth daily at 12 noon.         Total time spent taking care of this patient: 45 minutes  Signed: Garnette Mace, MD  04/06/2024, 12:11 PM     [1]  No current facility-administered medications on file prior to encounter.   Current Outpatient Medications on File Prior to Encounter  Medication Sig Dispense Refill   acetaminophen  (TYLENOL ) 500 MG tablet Take 500 mg by mouth every 6 (six) hours as needed.     albuterol  (VENTOLIN  HFA) 108 (90 Base) MCG/ACT inhaler Inhale 2 puffs into the lungs every 6 (six) hours as needed for wheezing or shortness of breath.  azelastine  (ASTELIN ) 0.1 % nasal spray Place 2 sprays into both nostrils 2 (two) times daily. Use in each nostril as directed (Patient not taking: Reported on 12/18/2023) 30 mL 12   benzonatate  (TESSALON ) 100 MG capsule Take 1 capsule (100 mg total) by mouth 3 (three) times daily as needed for  cough. 20 capsule 0   docusate sodium  (COLACE) 100 MG capsule Take 1 capsule (100 mg total) by mouth 2 (two) times daily. (Patient not taking: Reported on 03/31/2024) 60 capsule 2   EPINEPHrine  0.3 mg/0.3 mL IJ SOAJ injection Inject 0.3 mg into the muscle as needed for anaphylaxis. 1 each 5   famotidine  (PEPCID ) 40 MG tablet Take 1 tablet (40 mg total) by mouth 2 (two) times daily. 180 tablet 3   fluticasone  (FLONASE ) 50 MCG/ACT nasal spray Place 2 sprays into both nostrils 2 (two) times daily. (Patient taking differently: Place 2 sprays into both nostrils 2 (two) times daily as needed for allergies or rhinitis.) 16 g 6   ibuprofen  (ADVIL ) 600 MG tablet Take 1 tablet (600 mg total) by mouth every 6 (six) hours as needed. 30 tablet 0   levocetirizine (XYZAL ) 5 MG tablet Take 1 tablet (5 mg total) by mouth in the morning and at bedtime. 180 tablet 3   oxyCODONE  (OXY IR/ROXICODONE ) 5 MG immediate release tablet Take 1 tablet (5 mg total) by mouth every 4 (four) hours as needed for severe pain (pain score 7-10). 15 tablet 0   polyethylene glycol (MIRALAX / GLYCOLAX) 17 g packet Take 17 g by mouth daily as needed for mild constipation.     Prenatal Vit-Fe Fumarate-FA (PRENATAL MULTIVITAMIN) TABS tablet Take 1 tablet by mouth daily at 12 noon.    [2]  Allergies Allergen Reactions   Tetracycline Hives    Sob
